# Patient Record
Sex: Female | Born: 1965 | Race: White | Hispanic: Yes | Marital: Married | Smoking: Never smoker
Health system: Southern US, Community
[De-identification: ages and names within clinical notes are randomized; demographics above are authoritative.]

## PROBLEM LIST (undated history)

## (undated) DIAGNOSIS — I471 Supraventricular tachycardia, unspecified: Secondary | ICD-10-CM

## (undated) DIAGNOSIS — G43909 Migraine, unspecified, not intractable, without status migrainosus: Secondary | ICD-10-CM

## (undated) DIAGNOSIS — D242 Benign neoplasm of left breast: Secondary | ICD-10-CM

## (undated) DIAGNOSIS — E785 Hyperlipidemia, unspecified: Secondary | ICD-10-CM

## (undated) DIAGNOSIS — R739 Hyperglycemia, unspecified: Secondary | ICD-10-CM

## (undated) DIAGNOSIS — IMO0002 Reserved for concepts with insufficient information to code with codable children: Secondary | ICD-10-CM

## (undated) DIAGNOSIS — Z789 Other specified health status: Secondary | ICD-10-CM

## (undated) DIAGNOSIS — K219 Gastro-esophageal reflux disease without esophagitis: Secondary | ICD-10-CM

## (undated) DIAGNOSIS — J310 Chronic rhinitis: Secondary | ICD-10-CM

## (undated) DIAGNOSIS — J302 Other seasonal allergic rhinitis: Secondary | ICD-10-CM

## (undated) HISTORY — DX: Reserved for concepts with insufficient information to code with codable children: IMO0002

## (undated) HISTORY — PX: DILATION AND CURETTAGE OF UTERUS: SHX78

## (undated) HISTORY — DX: Hyperlipidemia, unspecified: E78.5

## (undated) HISTORY — DX: Hyperglycemia, unspecified: R73.9

## (undated) HISTORY — PX: BREAST SURGERY: SHX581

## (undated) HISTORY — PX: OTHER SURGICAL HISTORY: SHX169

## (undated) HISTORY — DX: Other specified health status: Z78.9

---

## 2009-02-22 ENCOUNTER — Ambulatory Visit (HOSPITAL_COMMUNITY): Admission: RE | Admit: 2009-02-22 | Discharge: 2009-02-22 | Payer: Self-pay | Admitting: Obstetrics & Gynecology

## 2010-01-11 ENCOUNTER — Inpatient Hospital Stay (HOSPITAL_COMMUNITY): Admission: AD | Admit: 2010-01-11 | Discharge: 2010-01-11 | Payer: Self-pay | Admitting: Obstetrics & Gynecology

## 2010-11-05 LAB — CBC
HCT: 40.6 % (ref 36.0–46.0)
MCHC: 34.2 g/dL (ref 30.0–36.0)
MCV: 93.6 fL (ref 78.0–100.0)
Platelets: 366 10*3/uL (ref 150–400)

## 2010-11-05 LAB — RH IMMUNE GLOBULIN WORKUP (NOT WOMEN'S HOSP)
ABO/RH(D): AB NEG
Antibody Screen: NEGATIVE

## 2010-11-05 LAB — ABO/RH: ABO/RH(D): AB NEG

## 2010-11-05 LAB — HCG, QUANTITATIVE, PREGNANCY: hCG, Beta Chain, Quant, S: 9966 m[IU]/mL — ABNORMAL HIGH (ref <5)

## 2010-12-10 ENCOUNTER — Other Ambulatory Visit: Payer: Self-pay | Admitting: Obstetrics & Gynecology

## 2010-12-10 DIAGNOSIS — Z3201 Encounter for pregnancy test, result positive: Secondary | ICD-10-CM

## 2010-12-17 ENCOUNTER — Encounter (HOSPITAL_COMMUNITY): Payer: Self-pay

## 2010-12-17 ENCOUNTER — Ambulatory Visit (HOSPITAL_COMMUNITY)
Admission: RE | Admit: 2010-12-17 | Discharge: 2010-12-17 | Disposition: A | Discharge status: Home / Self Care | Payer: Commercial Managed Care - PPO | Source: Ambulatory Visit | Attending: Obstetrics & Gynecology | Admitting: Obstetrics & Gynecology

## 2010-12-17 ENCOUNTER — Ambulatory Visit (HOSPITAL_COMMUNITY): Payer: Commercial Managed Care - PPO

## 2010-12-17 DIAGNOSIS — O368390 Maternal care for abnormalities of the fetal heart rate or rhythm, unspecified trimester, not applicable or unspecified: Secondary | ICD-10-CM | POA: Insufficient documentation

## 2010-12-17 DIAGNOSIS — Z3689 Encounter for other specified antenatal screening: Secondary | ICD-10-CM | POA: Insufficient documentation

## 2010-12-17 DIAGNOSIS — Z3201 Encounter for pregnancy test, result positive: Secondary | ICD-10-CM

## 2010-12-25 ENCOUNTER — Other Ambulatory Visit: Payer: Self-pay | Admitting: Obstetrics & Gynecology

## 2010-12-25 DIAGNOSIS — O3680X0 Pregnancy with inconclusive fetal viability, not applicable or unspecified: Secondary | ICD-10-CM

## 2010-12-31 ENCOUNTER — Ambulatory Visit (HOSPITAL_COMMUNITY)
Admission: RE | Admit: 2010-12-31 | Discharge: 2010-12-31 | Disposition: A | Discharge status: Home / Self Care | Payer: Commercial Managed Care - PPO | Source: Ambulatory Visit | Attending: Obstetrics & Gynecology | Admitting: Obstetrics & Gynecology

## 2010-12-31 DIAGNOSIS — O3680X0 Pregnancy with inconclusive fetal viability, not applicable or unspecified: Secondary | ICD-10-CM

## 2010-12-31 DIAGNOSIS — O368390 Maternal care for abnormalities of the fetal heart rate or rhythm, unspecified trimester, not applicable or unspecified: Secondary | ICD-10-CM | POA: Insufficient documentation

## 2010-12-31 DIAGNOSIS — Z3689 Encounter for other specified antenatal screening: Secondary | ICD-10-CM | POA: Insufficient documentation

## 2011-01-01 ENCOUNTER — Ambulatory Visit (HOSPITAL_COMMUNITY)
Admission: RE | Admit: 2011-01-01 | Discharge: 2011-01-01 | Disposition: A | Discharge status: Home / Self Care | Payer: Commercial Managed Care - PPO | Source: Ambulatory Visit | Attending: Obstetrics & Gynecology | Admitting: Obstetrics & Gynecology

## 2011-01-01 ENCOUNTER — Other Ambulatory Visit: Payer: Self-pay | Admitting: Obstetrics & Gynecology

## 2011-01-01 DIAGNOSIS — O021 Missed abortion: Secondary | ICD-10-CM | POA: Insufficient documentation

## 2011-01-01 LAB — CBC
MCH: 30.6 pg (ref 26.0–34.0)
RBC: 4.51 MIL/uL (ref 3.87–5.11)
RDW: 13.2 % (ref 11.5–15.5)
WBC: 9 10*3/uL (ref 4.0–10.5)

## 2011-01-02 LAB — RH IMMUNE GLOB WKUP(>/=20WKS)(NOT WOMEN'S HOSP)

## 2011-01-07 NOTE — Op Note (Signed)
  [name], Carrie Lang            ACCOUNT NO.:  [id number]  MEDICAL RECORD NO.:  [id number]           PATIENT TYPE:  O  LOCATION:  [facility]                          FACILITY:  [facility]  PHYSICIAN:  Roseanna Rainbow, M.D.DATE OF BIRTH:  [birth year]  DATE OF PROCEDURE:  01/01/2011 DATE OF DISCHARGE:                              OPERATIVE REPORT   PREOPERATIVE DIAGNOSES:  Embryonic demise, missed abortion.  POSTOPERATIVE DIAGNOSES:  Embryonic demise, missed abortion.  PROCEDURE:  Suction dilatation and curettage.  SURGEON:  Roseanna Rainbow, MD  ANESTHESIA:  Managed anesthesia care, paracervical block.  FINDINGS:  The uterus was anteverted, slightly enlarged.  PATHOLOGY:  Products of conception.  ESTIMATED BLOOD LOSS:  Minimal.  COMPLICATIONS:  None.  PROCEDURE:  The patient was taken to the operating room with an IV running.  She was placed in a semi-lithotomy position in [place] stirrups and prepped and draped in the usual sterile fashion.  After a time-out had been completed, a speculum was placed in the vagina.  The anterior lip of the cervix was then infiltrated with 2 mL of 1% lidocaine plain. The single-tooth tenaculum was then applied to this location.  4 mL of 1% lidocaine plain was then injected at 4 and 7 o'clock to produce a paracervical block.  The cervix was then dilated with [facility] dilators.  A 7-mm suction curette was then introduced into the uterine cavity.  The curette was then activated and the curette rotated to evacuate the uterus of products of conception.  A sharp curettage was performed and gritty texture was noted.  A final pass was then made with the suction curette.  The single-tooth tenaculum was then removed with minimal bleeding noted from the cervix.  At the close of the procedure, the instrument and pack counts were said to be correct x2.  The patient was taken to the PACU awake and in stable condition.     Roseanna Rainbow,  M.D.    Judee Clara  D:  01/01/2011  T:  2011/01/28  Job:  [id number]  Electronically Signed by Antionette Char M.D. on 01/07/2011 07:45:21 AM

## 2011-01-18 DEATH — deceased

## 2011-04-11 ENCOUNTER — Ambulatory Visit (HOSPITAL_BASED_OUTPATIENT_CLINIC_OR_DEPARTMENT_OTHER)
Admission: RE | Admit: 2011-04-11 | Discharge: 2011-04-11 | Disposition: A | Discharge status: Home / Self Care | Payer: Commercial Managed Care - PPO | Source: Ambulatory Visit | Attending: Orthopedic Surgery | Admitting: Orthopedic Surgery

## 2011-04-11 ENCOUNTER — Other Ambulatory Visit: Payer: Self-pay | Admitting: Orthopedic Surgery

## 2011-04-11 DIAGNOSIS — L923 Foreign body granuloma of the skin and subcutaneous tissue: Secondary | ICD-10-CM | POA: Insufficient documentation

## 2011-04-16 NOTE — Op Note (Signed)
NAMEMCKENZI, BUONOMO            ACCOUNT NO.:  [id number]  MEDICAL RECORD NO.:  [id number]  LOCATION:                                 FACILITY:  PHYSICIAN:  Katy Fitch. Carrie Lang, M.D. DATE OF BIRTH:  January 25, 1966  DATE OF PROCEDURE:  04/11/2011 DATE OF DISCHARGE:                              OPERATIVE REPORT   PREOPERATIVE DIAGNOSIS:  A 45-month history of retained foreign body left index finger pulp with granuloma formation.  POSTOPERATIVE DIAGNOSIS:  A 45-month history of retained foreign body left index finger pulp with granuloma formation.  OPERATIONS:  Resection of foreign body granuloma from left index finger radial pulp.  OPERATING SURGEON:  Katy Fitch. Carrie Mula, MD  No assistant.  ANESTHESIA:  2% lidocaine metacarpal head level block 2.5 mL left index finger.  This was a minor operating room procedure.  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  She accidentally impaled her left index finger with a wooden splinter more than 2 months ago.  She has tried on numerous occasions to relieve the splinter, however, it is trapped beneath the dermis causing foreign body granuloma.  This is painful for her and impairs her ability to function clinically.  She requested a Hand Surgery consult for excision.  We advised her as to the complexity of chronic foreign bodies.  Our plan is to under local anesthesia and tourniquet hemostasis, explore the fingertip.  It appears that this is adherent to the dermis, however, it is possible she has a deeper foreign body that may be challenging to find in the pulp or fat.  Our goal is to explore the finger pulp, remove the obvious granuloma and look for a deeper foreign body.  Questions regarding her  predicament were invited and answered in detail.  DESCRIPTION OF PROCEDURE:  Carrie Lang was  brought to room 1 of the [facility] and placed in supine position on the operating table.  Following an anesthesia informed consent, the palm was prepped with alcohol and Betadine followed by application of 2.5 mL metacarpal head level digital block including flexor sheath block.  After few moments, excellent anesthesia of the index finger was achieved.  The left hand and arm were then prepped with Betadine soap and solution and sterilely draped.  After a routine surgical time-out, the index finger was exsanguinated with gauze wrap and a half-inch Penrose drain placed over the proximal phalangeal segment of the finger as digital tourniquet.  The procedure commenced with an elliptical excision of the obvious granuloma.  This was quite firm and appeared to be both fibrotic and inflammatory.  The full-thickness of epidermis and dermis was excised with the apparent foreign body granuloma present.  We then carefully explored the pulp with a Therapist, nutritional and digital palpation.  I could not confidently identify any deeper foreign bodies.  The wound was then repaired with simple interrupted suture of 5-0 nylon.  Dr. Ludwig Lang was placed in compressive dressing with Xeroflo,sterile gauze, and Coban.  I have instructed her to elevate her finger today. She will keep the dressing clean and dry.  She will remove  the dressing in 3 days and begin use of Band-Aids and/or a Coban dressing to allow her to return to clinical medicine activities.  She will return to see me in the office for follow up in a week for suture removal.  There were no apparent complications.  We did send this specimen for pathologic evaluation due to the granuloma formation.     Katy Fitch Carrie Lang, M.D.     RVS/MEDQ  D:  04/11/2011  T:  04/11/2011  Job:  [id number]  cc:   Carrie Lang, M.D.  Electronically Signed by Carrie Lang M.D. on 04/16/2011 08:25:06 AM

## 2011-10-03 ENCOUNTER — Other Ambulatory Visit: Payer: Self-pay | Admitting: Obstetrics & Gynecology

## 2011-10-03 DIAGNOSIS — N939 Abnormal uterine and vaginal bleeding, unspecified: Secondary | ICD-10-CM

## 2011-10-10 ENCOUNTER — Encounter (HOSPITAL_COMMUNITY): Payer: Self-pay

## 2011-10-10 ENCOUNTER — Ambulatory Visit (HOSPITAL_COMMUNITY)
Admission: RE | Admit: 2011-10-10 | Discharge: 2011-10-10 | Disposition: A | Discharge status: Home / Self Care | Payer: Commercial Managed Care - PPO | Source: Ambulatory Visit | Attending: Obstetrics & Gynecology | Admitting: Obstetrics & Gynecology

## 2011-10-10 ENCOUNTER — Ambulatory Visit (HOSPITAL_COMMUNITY): Payer: Commercial Managed Care - PPO

## 2011-10-10 DIAGNOSIS — N949 Unspecified condition associated with female genital organs and menstrual cycle: Secondary | ICD-10-CM | POA: Insufficient documentation

## 2011-10-10 DIAGNOSIS — N939 Abnormal uterine and vaginal bleeding, unspecified: Secondary | ICD-10-CM

## 2011-10-10 DIAGNOSIS — N938 Other specified abnormal uterine and vaginal bleeding: Secondary | ICD-10-CM | POA: Insufficient documentation

## 2011-10-10 MED ORDER — IOHEXOL 300 MG/ML  SOLN
7.0000 mL | Freq: Once | INTRAMUSCULAR | Status: AC | PRN
  Administered 2011-10-10: 7 mL

## 2011-10-14 ENCOUNTER — Ambulatory Visit (HOSPITAL_COMMUNITY): Payer: Commercial Managed Care - PPO

## 2012-06-25 ENCOUNTER — Emergency Department (INDEPENDENT_AMBULATORY_CARE_PROVIDER_SITE_OTHER)
Admission: EM | Admit: 2012-06-25 | Discharge: 2012-06-25 | Disposition: A | Discharge status: Home / Self Care | Payer: Self-pay | Source: Home / Self Care | Attending: Family Medicine | Admitting: Family Medicine

## 2012-06-25 ENCOUNTER — Encounter (HOSPITAL_COMMUNITY): Payer: Self-pay | Admitting: *Deleted

## 2012-06-25 DIAGNOSIS — I889 Nonspecific lymphadenitis, unspecified: Secondary | ICD-10-CM

## 2012-06-25 HISTORY — DX: Chronic rhinitis: J31.0

## 2012-06-25 HISTORY — DX: Migraine, unspecified, not intractable, without status migrainosus: G43.909

## 2012-06-25 HISTORY — DX: Gastro-esophageal reflux disease without esophagitis: K21.9

## 2012-06-25 LAB — COMPREHENSIVE METABOLIC PANEL
ALT: 25 U/L (ref 0–35)
AST: 26 U/L (ref 0–37)
Albumin: 4.1 g/dL (ref 3.5–5.2)
Alkaline Phosphatase: 61 U/L (ref 39–117)
Glucose, Bld: 95 mg/dL (ref 70–99)
Potassium: 3.7 mEq/L (ref 3.5–5.1)
Sodium: 139 mEq/L (ref 135–145)
Total Protein: 8.3 g/dL (ref 6.0–8.3)

## 2012-06-25 LAB — CBC WITH DIFFERENTIAL/PLATELET
Basophils Absolute: 0 10*3/uL (ref 0.0–0.1)
Basophils Relative: 0 % (ref 0–1)
Eosinophils Absolute: 0.1 10*3/uL (ref 0.0–0.7)
Lymphs Abs: 1.6 10*3/uL (ref 0.7–4.0)
MCH: 29.9 pg (ref 26.0–34.0)
Neutrophils Relative %: 67 % (ref 43–77)
Platelets: 354 10*3/uL (ref 150–400)
RBC: 4.75 MIL/uL (ref 3.87–5.11)

## 2012-06-25 LAB — POCT URINALYSIS DIP (DEVICE)
Bilirubin Urine: NEGATIVE
Ketones, ur: NEGATIVE mg/dL
Nitrite: NEGATIVE
Protein, ur: 30 mg/dL — AB
Urobilinogen, UA: 0.2 mg/dL (ref 0.0–1.0)
pH: 6.5 (ref 5.0–8.0)

## 2012-06-25 LAB — POCT INFECTIOUS MONO SCREEN: Mono Screen: NEGATIVE

## 2012-06-25 MED ORDER — FLUCONAZOLE 150 MG PO TABS: ORAL_TABLET | ORAL | Status: DC

## 2012-06-25 MED ORDER — METRONIDAZOLE 500 MG PO TABS: 500.0000 mg | ORAL_TABLET | Freq: Two times a day (BID) | ORAL | Status: DC

## 2012-06-25 NOTE — ED Notes (Signed)
Pt    Reports  Symptoms  Of  Fever  Earlier  As  Well  As  Swollen  Inguinal  Lymph         And  Vaginal  Discharge             Symptoms  X    sev  Days   -  Walks   Upright  With  Steady  Fluid  Gait      Pleasant  Appears  In nioo  Severe  Distress

## 2012-06-26 LAB — GC/CHLAMYDIA PROBE AMP, GENITAL: GC Probe Amp, Genital: NEGATIVE

## 2012-06-26 LAB — CMV IGM: CMV IgM: 0.25 (ref <0.90)

## 2012-06-26 NOTE — ED Provider Notes (Signed)
History     CSN: [id number]  Arrival date & time 06/25/12  1129   First MD Initiated Contact with Patient 06/25/12 1238      Chief Complaint  Patient presents with  . Fever    (Consider location/radiation/quality/duration/timing/severity/associated sxs/prior treatment) HPI Comments: 46 y/o female no significant PMH here c/o brief episode of fever, generalized malaise and body aches yesterday. Symptoms associated today with enlarged tender lymph nodes in both groins, vaginal itchiness and burning and mild vaginal discharge. No prior h/o HSV or STD's. She is in a monogamous relations ship and the couple has been extensively screened recently for STD's as they have been managed for infertility (IVF treatment). Patient is not pregnant currently. 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   Past Medical History  Diagnosis Date  . GERD (gastroesophageal reflux disease)   . Migraine   . Rhinitis     History reviewed. No pertinent past surgical history.  No family history on file.  History  Substance Use Topics  . Smoking status: Never Smoker   . Smokeless tobacco: Not on file  . Alcohol Use: No    OB History    Grav Para Term Preterm Abortions TAB SAB Ect Mult Living   1               Review of Systems  Constitutional: Positive for fever and fatigue.  HENT: Negative for sore throat.   Gastrointestinal: Negative for nausea, vomiting, abdominal pain and diarrhea.  Genitourinary: Positive for vaginal discharge and vaginal pain. Negative for dysuria, frequency, hematuria, flank pain, vaginal bleeding, genital sores and pelvic pain.  Musculoskeletal: Positive for myalgias and arthralgias. Negative for joint swelling.  Skin: Negative for rash.  Neurological: Negative for dizziness and headaches.  Hematological: Positive for adenopathy.  All other systems reviewed and are negative.    Allergies  Review of  patient's allergies indicates no known allergies.  Home Medications   Current Outpatient Rx  Name  Route  Sig  Dispense  Refill  . DEXILANT PO   Oral   Take by mouth.         Marland Kitchen FLUCONAZOLE 150 MG PO TABS      1 tab po x1 reapeat in 72 hours.   2 tablet   0   . METRONIDAZOLE 500 MG PO TABS   Oral   Take 1 tablet (500 mg total) by mouth 2 (two) times daily.   14 tablet   0     BP 122/76  Pulse 72  Temp 98.6 F (37 C) (Oral)  Resp 16  SpO2 100%  LMP 06/16/2012  Breastfeeding? No  Physical Exam  Nursing note and vitals reviewed. Constitutional: She is oriented to person, place, and time. She appears well-developed and well-nourished. No distress.  HENT:  Head: Normocephalic and atraumatic.  Nose: Nose normal.       Mild pharyngeal erythema no exudates.  Eyes: Conjunctivae normal are normal. Pupils are equal, round, and reactive to light. No scleral icterus.  Neck: Neck supple. No thyromegaly present.       Mildly enlarged tender submandibular left neck lymphnode.   Cardiovascular: Normal rate, regular rhythm and normal heart sounds.   Pulmonary/Chest: Effort normal and breath sounds normal. No respiratory distress. She has no wheezes. She has no rales. She exhibits no tenderness.  Abdominal: Soft. Bowel sounds are normal. She exhibits no distension and no mass. There is no tenderness.  There is no rebound and no guarding.       No hepatosplenomegaly.  Genitourinary: Uterus normal.    There is no rash, tenderness, lesion or injury on the right labia. There is no rash, tenderness, lesion or injury on the left labia. Cervix exhibits no motion tenderness, no discharge and no friability. Right adnexum displays no mass, no tenderness and no fullness. Left adnexum displays no mass, no tenderness and no fullness. There is erythema around the vagina. Vaginal discharge found.    Lymphadenopathy:       Head (left side): Submandibular adenopathy present.    She has cervical  adenopathy.       Right cervical: No superficial cervical and no deep cervical adenopathy present.      Left cervical: Superficial cervical adenopathy present. No deep cervical adenopathy present.       Right axillary: No pectoral and no lateral adenopathy present.       Left axillary: No pectoral and no lateral adenopathy present.      Right: Inguinal adenopathy present. No supraclavicular and no epitrochlear adenopathy present.       Left: Inguinal adenopathy present. No supraclavicular and no epitrochlear adenopathy present.       marginally enlarged tender lymph nodes as above. Firm but no hard. No fluctuating no associated with erythema cellulitis or drainage. No attached to deep planes.  Neurological: She is alert and oriented to person, place, and time.  Skin: No rash noted. She is not diaphoretic.    ED Course  Procedures (including critical care time)  Labs Reviewed  POCT URINALYSIS DIP (DEVICE) - Abnormal; Notable for the following:    Hgb urine dipstick LARGE (*)     Protein, ur 30 (*)     All other components within normal limits  COMPREHENSIVE METABOLIC PANEL - Abnormal; Notable for the following:    Total Bilirubin 0.2 (*)     All other components within normal limits  POCT PREGNANCY, URINE  CBC WITH DIFFERENTIAL  SEDIMENTATION RATE  POCT INFECTIOUS MONO SCREEN  HERPES SIMPLEX VIRUS CULTURE  CMV IGM  MISCELLANEOUS TEST   No results found.   1. Lymphadenitis       MDM  Impress reactive lymphadenopathies. Reports known chronic trace hematuria. Likely related to acute viral illness. Normal CBC w/diff, sed rate and CMET and poc Mono screen. HSV pending, CMV IgG, GC/CHl pending. Supportive care reccommended.         Sharin Grave, MD 06/26/12 [phone]

## 2012-06-26 NOTE — ED Notes (Signed)
Waiting for final report resulting on labs

## 2012-06-29 LAB — HERPES SIMPLEX VIRUS CULTURE: Culture: NOT DETECTED

## 2012-09-18 ENCOUNTER — Encounter (HOSPITAL_COMMUNITY): Payer: Self-pay | Admitting: Pharmacist

## 2012-09-21 ENCOUNTER — Encounter (HOSPITAL_COMMUNITY): Payer: Self-pay | Admitting: *Deleted

## 2012-09-30 ENCOUNTER — Encounter (HOSPITAL_COMMUNITY): Payer: Self-pay

## 2012-09-30 ENCOUNTER — Encounter (HOSPITAL_COMMUNITY)
Admission: RE | Admit: 2012-09-30 | Discharge: 2012-09-30 | Disposition: A | Discharge status: Home / Self Care | Payer: Commercial Managed Care - PPO | Source: Ambulatory Visit | Attending: Obstetrics & Gynecology | Admitting: Obstetrics & Gynecology

## 2012-09-30 HISTORY — DX: Supraventricular tachycardia, unspecified: I47.10

## 2012-09-30 HISTORY — DX: Supraventricular tachycardia: I47.1

## 2012-09-30 LAB — CBC
MCH: 30.1 pg (ref 26.0–34.0)
MCHC: 33.7 g/dL (ref 30.0–36.0)
MCV: 89.4 fL (ref 78.0–100.0)
Platelets: 382 10*3/uL (ref 150–400)

## 2012-09-30 NOTE — Patient Instructions (Addendum)
20 SIA GABRIELSEN  09/30/2012   Your procedure is scheduled on:  10/02/12  Enter through the Main Entrance of [facility] at 1100 AM.  Pick up the phone at the desk and dial [phone].   Call this number if you have problems the morning of surgery: [phone]   Remember:   Do not eat food:After Midnight.  Do not drink clear liquids: After Midnight.  Take these medicines the morning of surgery with A SIP OF WATER: NA   Do not wear jewelry, make-up or nail polish.  Do not wear lotions, powders, or perfumes. You may wear deodorant.  Do not shave 48 hours prior to surgery.  Do not bring valuables to the hospital.  Contacts, dentures or bridgework may not be worn into surgery.  Leave suitcase in the car. After surgery it may be brought to your room.  For patients admitted to the hospital, checkout time is 11:00 AM the day of discharge.   Patients discharged the day of surgery will not be allowed to drive home.  Name and phone number of your driver: husband   Wende Mott  Special Instructions: Shower using CHG 2 nights before surgery and the night before surgery.  If you shower the day of surgery use CHG.  Use special wash - you have one bottle of CHG for all showers.  You should use approximately 1/3 of the bottle for each shower.   Please read over the following fact sheets that you were given: Surgical Site Infection Prevention

## 2012-10-02 ENCOUNTER — Encounter (HOSPITAL_COMMUNITY): Payer: Self-pay | Admitting: Anesthesiology

## 2012-10-02 ENCOUNTER — Ambulatory Visit (HOSPITAL_COMMUNITY)
Admission: RE | Admit: 2012-10-02 | Discharge: 2012-10-02 | Disposition: A | Discharge status: Home / Self Care | Payer: Commercial Managed Care - PPO | Source: Ambulatory Visit | Attending: Obstetrics & Gynecology | Admitting: Obstetrics & Gynecology

## 2012-10-02 ENCOUNTER — Encounter (HOSPITAL_COMMUNITY): Payer: Self-pay | Admitting: *Deleted

## 2012-10-02 ENCOUNTER — Encounter (HOSPITAL_COMMUNITY)
Admission: RE | Disposition: A | Discharge status: Home / Self Care | Payer: Self-pay | Source: Ambulatory Visit | Attending: Obstetrics & Gynecology

## 2012-10-02 ENCOUNTER — Ambulatory Visit (HOSPITAL_COMMUNITY): Payer: Commercial Managed Care - PPO | Admitting: Anesthesiology

## 2012-10-02 DIAGNOSIS — N840 Polyp of corpus uteri: Secondary | ICD-10-CM | POA: Diagnosis present

## 2012-10-02 DIAGNOSIS — N938 Other specified abnormal uterine and vaginal bleeding: Secondary | ICD-10-CM | POA: Insufficient documentation

## 2012-10-02 DIAGNOSIS — N949 Unspecified condition associated with female genital organs and menstrual cycle: Secondary | ICD-10-CM | POA: Insufficient documentation

## 2012-10-02 HISTORY — PX: HYSTEROSCOPY WITH D & C: SHX1775

## 2012-10-02 HISTORY — DX: Other seasonal allergic rhinitis: J30.2

## 2012-10-02 LAB — PREGNANCY, URINE: Preg Test, Ur: NEGATIVE

## 2012-10-02 SURGERY — DILATATION AND CURETTAGE /HYSTEROSCOPY
Anesthesia: General | Site: Vagina | Wound class: Clean Contaminated

## 2012-10-02 MED ORDER — LIDOCAINE HCL (CARDIAC) 20 MG/ML IV SOLN
INTRAVENOUS | Status: AC
  Filled 2012-10-02: qty 5

## 2012-10-02 MED ORDER — PROPOFOL 10 MG/ML IV BOLUS
INTRAVENOUS | Status: DC | PRN
  Administered 2012-10-02: 170 mg via INTRAVENOUS

## 2012-10-02 MED ORDER — LIDOCAINE HCL (CARDIAC) 20 MG/ML IV SOLN
INTRAVENOUS | Status: DC | PRN
  Administered 2012-10-02: 50 mg via INTRAVENOUS

## 2012-10-02 MED ORDER — MIDAZOLAM HCL 2 MG/2ML IJ SOLN
INTRAMUSCULAR | Status: AC
  Filled 2012-10-02: qty 2

## 2012-10-02 MED ORDER — ONDANSETRON HCL 4 MG/2ML IJ SOLN: 4.0000 mg | Freq: Four times a day (QID) | INTRAMUSCULAR | Status: DC | PRN

## 2012-10-02 MED ORDER — LACTATED RINGERS IV SOLN
INTRAVENOUS | Status: DC
  Administered 2012-10-02: 13:00:00 via INTRAVENOUS
  Administered 2012-10-02: 50 mL/h via INTRAVENOUS

## 2012-10-02 MED ORDER — OXYCODONE-ACETAMINOPHEN 5-325 MG PO TABS: 2.0000 | ORAL_TABLET | Freq: Four times a day (QID) | ORAL | Status: DC | PRN

## 2012-10-02 MED ORDER — FENTANYL CITRATE 0.05 MG/ML IJ SOLN: 25.0000 ug | INTRAMUSCULAR | Status: DC | PRN

## 2012-10-02 MED ORDER — ONDANSETRON HCL 4 MG/2ML IJ SOLN
INTRAMUSCULAR | Status: DC | PRN
  Administered 2012-10-02: 4 mg via INTRAVENOUS

## 2012-10-02 MED ORDER — SODIUM CHLORIDE 0.9 % IJ SOLN: 3.0000 mL | Freq: Two times a day (BID) | INTRAMUSCULAR | Status: DC

## 2012-10-02 MED ORDER — MIDAZOLAM HCL 5 MG/5ML IJ SOLN
INTRAMUSCULAR | Status: DC | PRN
  Administered 2012-10-02: 2 mg via INTRAVENOUS

## 2012-10-02 MED ORDER — SODIUM CHLORIDE 0.9 % IV SOLN: 250.0000 mL | INTRAVENOUS | Status: DC | PRN

## 2012-10-02 MED ORDER — LIDOCAINE HCL 1 % IJ SOLN
INTRAMUSCULAR | Status: DC | PRN
  Administered 2012-10-02: 10 mL

## 2012-10-02 MED ORDER — ACETAMINOPHEN 650 MG RE SUPP
650.0000 mg | RECTAL | Status: DC | PRN
  Filled 2012-10-02: qty 1

## 2012-10-02 MED ORDER — KETOROLAC TROMETHAMINE 30 MG/ML IJ SOLN
INTRAMUSCULAR | Status: DC | PRN
  Administered 2012-10-02: 30 mg via INTRAVENOUS

## 2012-10-02 MED ORDER — SODIUM CHLORIDE 0.9 % IJ SOLN: 3.0000 mL | INTRAMUSCULAR | Status: DC | PRN

## 2012-10-02 MED ORDER — ACETAMINOPHEN 325 MG PO TABS: 650.0000 mg | ORAL_TABLET | ORAL | Status: DC | PRN

## 2012-10-02 MED ORDER — DEXAMETHASONE SODIUM PHOSPHATE 10 MG/ML IJ SOLN
INTRAMUSCULAR | Status: AC
  Filled 2012-10-02: qty 1

## 2012-10-02 MED ORDER — KETOROLAC TROMETHAMINE 30 MG/ML IJ SOLN
INTRAMUSCULAR | Status: AC
  Filled 2012-10-02: qty 1

## 2012-10-02 MED ORDER — DEXAMETHASONE SODIUM PHOSPHATE 10 MG/ML IJ SOLN
INTRAMUSCULAR | Status: DC | PRN
  Administered 2012-10-02: 10 mg via INTRAVENOUS

## 2012-10-02 MED ORDER — SODIUM CHLORIDE 0.9 % IR SOLN
Status: DC | PRN
  Administered 2012-10-02: 3000 mL

## 2012-10-02 MED ORDER — FENTANYL CITRATE 0.05 MG/ML IJ SOLN
INTRAMUSCULAR | Status: AC
  Filled 2012-10-02: qty 2

## 2012-10-02 MED ORDER — ONDANSETRON HCL 4 MG/2ML IJ SOLN
INTRAMUSCULAR | Status: AC
  Filled 2012-10-02: qty 2

## 2012-10-02 MED ORDER — FENTANYL CITRATE 0.05 MG/ML IJ SOLN
INTRAMUSCULAR | Status: DC | PRN
  Administered 2012-10-02: 25 ug via INTRAVENOUS
  Administered 2012-10-02: 50 ug via INTRAVENOUS
  Administered 2012-10-02: 25 ug via INTRAVENOUS

## 2012-10-02 MED ORDER — OXYCODONE HCL 5 MG PO TABS: 5.0000 mg | ORAL_TABLET | ORAL | Status: DC | PRN

## 2012-10-02 MED ORDER — PROPOFOL 10 MG/ML IV EMUL
INTRAVENOUS | Status: AC
  Filled 2012-10-02: qty 20

## 2012-10-02 SURGICAL SUPPLY — 18 items
BLADE INCISOR TRUC PLUS 2.9 (ABLATOR) ×1 IMPLANT
CANISTER SUCTION 2500CC (MISCELLANEOUS) ×2 IMPLANT
CATH ROBINSON RED A/P 16FR (CATHETERS) ×2 IMPLANT
CLOTH BEACON ORANGE TIMEOUT ST (SAFETY) ×2 IMPLANT
CONTAINER PREFILL 10% NBF 60ML (FORM) ×4 IMPLANT
DRESSING TELFA 8X3 (GAUZE/BANDAGES/DRESSINGS) ×2 IMPLANT
ELECT REM PT RETURN 9FT ADLT (ELECTROSURGICAL) ×2
ELECTRODE REM PT RTRN 9FT ADLT (ELECTROSURGICAL) ×1 IMPLANT
GLOVE BIO SURGEON STRL SZ 6.5 (GLOVE) ×4 IMPLANT
GOWN STRL REIN XL XLG (GOWN DISPOSABLE) ×4 IMPLANT
INCISOR TRUC PLUS BLADE 2.9 (ABLATOR) ×2
KIT HYSTEROSCOPY TRUCLEAR (ABLATOR) ×2 IMPLANT
LOOP ANGLED CUTTING 22FR (CUTTING LOOP) IMPLANT
NEEDLE SPNL 20GX3.5 QUINCKE YW (NEEDLE) IMPLANT
PACK HYSTEROSCOPY LF (CUSTOM PROCEDURE TRAY) ×2 IMPLANT
PAD OB MATERNITY 4.3X12.25 (PERSONAL CARE ITEMS) ×2 IMPLANT
TOWEL OR 17X24 6PK STRL BLUE (TOWEL DISPOSABLE) ×4 IMPLANT
WATER STERILE IRR 1000ML POUR (IV SOLUTION) ×2 IMPLANT

## 2012-10-02 NOTE — Anesthesia Preprocedure Evaluation (Signed)

## 2012-10-02 NOTE — H&P (Signed)
  Chief Complaint: 46 y.o.  who presents with AUB   Details of Present Illness: The work-up to date has included imaging that is suspect for an endometrial polyp.  Ht 5\' 6"  (1.676 m)  Wt 70.308 kg (155 lb)  BMI 25.03 kg/m2  Past Medical History  Diagnosis Date  . GERD (gastroesophageal reflux disease)   . Migraine   . Rhinitis   . Seasonal allergies   . SVT (supraventricular tachycardia)    History   Social History  . Marital Status: Married    Spouse Name: N/A    Number of Children: N/A  . Years of Education: N/A   Occupational History  . Not on file.   Social History Main Topics  . Smoking status: Never Smoker   . Smokeless tobacco: Not on file  . Alcohol Use: No  . Drug Use: No  . Sexually Active: Not on file   Other Topics Concern  . Not on file   Social History Narrative  . No narrative on file   No family history on file.  Pertinent items are noted in HPI.  Pre-Op Diagnosis: abnormal uterine bleeding/ poylp   Planned Procedure: Procedure(s): DILATATION AND CURETTAGE /HYSTEROSCOPY  I have reviewed the patient's history and have completed the physical exam and Carrie Lang is acceptable for surgery.  Roseanna Rainbow, MD 10/02/2012 7:32 AM

## 2012-10-02 NOTE — Op Note (Signed)
Preoperative diagnosis: AUB, endometrial polyp  Postoperative diagnosis: same  Procedure: Operative hysteroscopy, polypectomy with Truclear, dilatation and curettage  Surgeon: Antionette Char A  Anesthesia: LMA, paracervical block  Estimated blood loss: 100 ml  Urine output: per Anesthesiology  IV Fluids: per Anesthesiology  Complications: none  Specimen: PATHOLOGY  Operative Findings: The tubal ostia were visualized.  The endometrium posteriorly was slightly lush in appearance.  There was 1 cm polyp arising from the posterior wall, lower uterine segment.  Description of procedure:   The patient was taken to the operating room and placed on the operating table in the semi-lithotomy position in [place] stirrups.  Examination under anesthesia was performed.  The patient was prepped and draped in the usual manner.  After a time-out had been completed, a speculum was placed in the vagina.  The anterior lip of the cervix was grasped with a single-toothed tenaculum.    10 cc of 1% lidocaine were injected at 4 and 7 o'clock to produce a paracervical block.    The endocervical canal was dilated with Shawnie Pons dilators.  A  diagnostic hysteroscope with Glycine as the distending medium was used to perform a diagnostic hysteroscopy.  The findings are noted above.    The polyp was then morcellated with the Truclear instrument.  The endometrium was biopsied with the instrument as well.  The hysteroscope was removed.   All the instruments were removed from the vagina.  Final instrument counts were correct.  The patient was taken to the PACU in stable condition.

## 2012-10-02 NOTE — Anesthesia Postprocedure Evaluation (Signed)
  Anesthesia Post-op Note  Patient: Carrie Lang  Procedure(s) Performed: Procedure(s) with comments: DILATATION AND CURETTAGE /HYSTEROSCOPY (N/A) - POLYPECTOMY/ POSSIBLE TRUCLER  Patient is awake and responsive. Pain and nausea are reasonably well controlled. Vital signs are stable and clinically acceptable. Oxygen saturation is clinically acceptable. There are no apparent anesthetic complications at this time. Patient is ready for discharge.

## 2012-10-02 NOTE — Preoperative (Signed)
Beta Blockers   Reason not to administer Beta Blockers:Not Applicable 

## 2012-10-02 NOTE — Transfer of Care (Signed)
Immediate Anesthesia Transfer of Care Note  Patient: Carrie Lang  Procedure(s) Performed: Procedure(s) with comments: DILATATION AND CURETTAGE /HYSTEROSCOPY (N/A) - POLYPECTOMY/ POSSIBLE TRUCLER  Patient Location: PACU  Anesthesia Type:General  Level of Consciousness: awake, alert  and oriented  Airway & Oxygen Therapy: Patient Spontanous Breathing and Patient connected to nasal cannula oxygen  Post-op Assessment: Report given to PACU RN  Post vital signs: Reviewed  Complications: No apparent anesthesia complications

## 2012-10-05 ENCOUNTER — Encounter (HOSPITAL_COMMUNITY): Payer: Self-pay | Admitting: Obstetrics & Gynecology

## 2012-11-12 ENCOUNTER — Encounter: Payer: Self-pay | Admitting: Obstetrics & Gynecology

## 2012-11-12 ENCOUNTER — Ambulatory Visit (INDEPENDENT_AMBULATORY_CARE_PROVIDER_SITE_OTHER): Payer: Commercial Managed Care - PPO | Admitting: Obstetrics & Gynecology

## 2012-11-12 VITALS — BP 147/79 | HR 92 | Temp 98.3°F | Ht 66.0 in | Wt 160.0 lb

## 2012-11-12 DIAGNOSIS — Z124 Encounter for screening for malignant neoplasm of cervix: Secondary | ICD-10-CM

## 2012-11-12 MED ORDER — NESTABS 32-1 MG PO TABS: 1.0000 | ORAL_TABLET | Freq: Every morning | ORAL | Status: DC

## 2012-11-12 NOTE — Progress Notes (Signed)
Subjective:     Carrie Lang is a 47 y.o. female who presents to the clinic 6 weeks status post diagnostic hysteroscopy for polyps. Eating a regular diet with difficulty. Bowel movements are normal. The patient is not having any pain.  The following portions of the patient's history were reviewed and updated as appropriate: allergies, current medications, past family history, past medical history, past social history, past surgical history and problem list.  Review of Systems Pertinent items are noted in HPI.    Objective:    BP 147/79  Pulse 92  Temp(Src) 98.3 F (36.8 C) (Oral)  Ht 5\' 6"  (1.676 m)  Wt 160 lb (72.576 kg)  BMI 25.84 kg/m2  LMP 09/28/2012 General:  alert  Abdomen: soft, bowel sounds active, non-tender  Incision:   n/a    Pelvic: Bimanual: uterus, adnexa NT Assessment:    Doing well postoperatively. Operative findings again reviewed. Pathology report discussed.    Plan:    1. Continue any current medications. 2. Wound care discussed. 3. Activity restrictions: none 4. Anticipated return to work: now. 5. Follow up: 6 mths

## 2012-11-17 LAB — PAP IG AND HPV HIGH-RISK

## 2012-12-21 ENCOUNTER — Other Ambulatory Visit: Payer: Self-pay | Admitting: Obstetrics & Gynecology

## 2012-12-21 DIAGNOSIS — N979 Female infertility, unspecified: Secondary | ICD-10-CM

## 2012-12-22 ENCOUNTER — Other Ambulatory Visit: Payer: Commercial Managed Care - PPO

## 2012-12-22 ENCOUNTER — Ambulatory Visit (HOSPITAL_COMMUNITY)
Admission: RE | Admit: 2012-12-22 | Discharge: 2012-12-22 | Disposition: A | Discharge status: Home / Self Care | Payer: Commercial Managed Care - PPO | Source: Ambulatory Visit | Attending: Obstetrics & Gynecology | Admitting: Obstetrics & Gynecology

## 2012-12-22 DIAGNOSIS — D251 Intramural leiomyoma of uterus: Secondary | ICD-10-CM | POA: Insufficient documentation

## 2012-12-22 DIAGNOSIS — N979 Female infertility, unspecified: Secondary | ICD-10-CM | POA: Insufficient documentation

## 2012-12-22 DIAGNOSIS — D252 Subserosal leiomyoma of uterus: Secondary | ICD-10-CM | POA: Insufficient documentation

## 2012-12-22 DIAGNOSIS — N926 Irregular menstruation, unspecified: Secondary | ICD-10-CM

## 2012-12-23 ENCOUNTER — Other Ambulatory Visit: Payer: Self-pay | Admitting: Obstetrics & Gynecology

## 2012-12-23 ENCOUNTER — Ambulatory Visit (HOSPITAL_COMMUNITY): Payer: Commercial Managed Care - PPO

## 2012-12-23 ENCOUNTER — Other Ambulatory Visit: Payer: Commercial Managed Care - PPO

## 2012-12-24 ENCOUNTER — Telehealth: Payer: Self-pay | Admitting: *Deleted

## 2012-12-24 NOTE — Telephone Encounter (Signed)
Pt in office for lab - Rx called for 17 beta estradiol 2 mg 1 po tid #90  And Progesterone 200 mg 1 tid #30 called to [facility] per Dr Tamela Oddi

## 2012-12-25 ENCOUNTER — Other Ambulatory Visit: Payer: Self-pay | Admitting: Obstetrics & Gynecology

## 2012-12-25 DIAGNOSIS — N979 Female infertility, unspecified: Secondary | ICD-10-CM

## 2013-01-04 ENCOUNTER — Other Ambulatory Visit: Payer: Self-pay | Admitting: Obstetrics & Gynecology

## 2013-01-04 ENCOUNTER — Ambulatory Visit (HOSPITAL_COMMUNITY)
Admission: RE | Admit: 2013-01-04 | Discharge: 2013-01-04 | Disposition: A | Discharge status: Home / Self Care | Payer: Commercial Managed Care - PPO | Source: Ambulatory Visit | Attending: Obstetrics & Gynecology | Admitting: Obstetrics & Gynecology

## 2013-01-04 DIAGNOSIS — N979 Female infertility, unspecified: Secondary | ICD-10-CM | POA: Insufficient documentation

## 2013-03-09 ENCOUNTER — Encounter: Payer: Self-pay | Admitting: Gastroenterology

## 2013-03-22 ENCOUNTER — Encounter: Payer: Self-pay | Admitting: Obstetrics & Gynecology

## 2013-03-30 ENCOUNTER — Ambulatory Visit: Payer: Commercial Managed Care - PPO | Admitting: Gastroenterology

## 2013-05-17 ENCOUNTER — Ambulatory Visit: Payer: Commercial Managed Care - PPO | Admitting: Obstetrics & Gynecology

## 2013-06-24 ENCOUNTER — Other Ambulatory Visit: Payer: Self-pay

## 2013-06-24 ENCOUNTER — Encounter: Payer: Self-pay | Admitting: *Deleted

## 2013-06-24 ENCOUNTER — Encounter: Payer: Self-pay | Admitting: Obstetrics & Gynecology

## 2014-01-05 ENCOUNTER — Encounter: Payer: Self-pay | Admitting: Obstetrics & Gynecology

## 2014-01-05 ENCOUNTER — Ambulatory Visit (INDEPENDENT_AMBULATORY_CARE_PROVIDER_SITE_OTHER): Payer: Commercial Managed Care - PPO | Admitting: Obstetrics & Gynecology

## 2014-01-05 VITALS — BP 120/77 | HR 77 | Temp 98.0°F | Ht 66.0 in | Wt 155.0 lb

## 2014-01-05 DIAGNOSIS — D259 Leiomyoma of uterus, unspecified: Secondary | ICD-10-CM

## 2014-01-05 DIAGNOSIS — Z01419 Encounter for gynecological examination (general) (routine) without abnormal findings: Secondary | ICD-10-CM

## 2014-01-05 DIAGNOSIS — Z3202 Encounter for pregnancy test, result negative: Secondary | ICD-10-CM

## 2014-01-05 DIAGNOSIS — N979 Female infertility, unspecified: Secondary | ICD-10-CM | POA: Insufficient documentation

## 2014-01-05 LAB — COMPREHENSIVE METABOLIC PANEL
ALK PHOS: 56 U/L (ref 39–117)
ALT: 29 U/L (ref 0–35)
AST: 24 U/L (ref 0–37)
Albumin: 4.6 g/dL (ref 3.5–5.2)
BILIRUBIN TOTAL: 0.5 mg/dL (ref 0.2–1.2)
BUN: 8 mg/dL (ref 6–23)
CO2: 23 meq/L (ref 19–32)
CREATININE: 0.79 mg/dL (ref 0.50–1.10)
Calcium: 9.3 mg/dL (ref 8.4–10.5)
Chloride: 102 mEq/L (ref 96–112)
Glucose, Bld: 83 mg/dL (ref 70–99)
Potassium: 4 mEq/L (ref 3.5–5.3)
Sodium: 136 mEq/L (ref 135–145)
Total Protein: 7.4 g/dL (ref 6.0–8.3)

## 2014-01-05 LAB — CBC
HEMATOCRIT: 40.9 % (ref 36.0–46.0)
Hemoglobin: 13.9 g/dL (ref 12.0–15.0)
MCH: 29.1 pg (ref 26.0–34.0)
MCHC: 34 g/dL (ref 30.0–36.0)
MCV: 85.7 fL (ref 78.0–100.0)
Platelets: 413 10*3/uL — ABNORMAL HIGH (ref 150–400)
RBC: 4.77 MIL/uL (ref 3.87–5.11)
RDW: 14.3 % (ref 11.5–15.5)
WBC: 8.4 10*3/uL (ref 4.0–10.5)

## 2014-01-05 LAB — TSH: TSH: 0.85 u[IU]/mL (ref 0.350–4.500)

## 2014-01-05 NOTE — Progress Notes (Signed)
Subjective:     Carrie Lang is a 48 y.o. female here for a routine exam.   Personal health questionnaire:  Is patient Ashkenazi Jewish, have a family history of breast and/or ovarian cancer: no Is there a family history of uterine cancer diagnosed at age < 52, gastrointestinal cancer, urinary tract cancer, family member who is a Field seismologist syndrome-associated carrier: no Is the patient overweight and hypertensive, family history of diabetes, personal history of gestational diabetes or PCOS: no Is patient over 63, have PCOS,  family history of premature CHD under age 45, diabetes, smoke, have hypertension or peripheral artery disease:  no   Gynecologic History Patient's last menstrual period was 11/24/2013. Contraception: none Last Pap: 2014. Results were: normal Last mammogram: 1 yr ago. Results were: dense breasts  Obstetric History OB History  Gravida Para Term Preterm AB SAB TAB Ectopic Multiple Living  2    2 2         # Outcome Date GA Lbr Len/2nd Weight Sex Delivery Anes PTL Lv  2 SAB 2012 [redacted]w[redacted]d       N  1 SAB 2011 [redacted]w[redacted]d       N     Comments: System Generated. Please review and update pregnancy details.      Past Medical History  Diagnosis Date  . GERD (gastroesophageal reflux disease)   . Migraine   . Rhinitis   . Seasonal allergies   . SVT (supraventricular tachycardia)   . Gluten free diet   . Hyperlipidemia   . Ulcer     Past Surgical History  Procedure Laterality Date  . Rectal polyps      removed  . Dilation and curettage of uterus      TAB x 2  . Breast surgery      right    nodule removed - benign  . Hysteroscopy w/d&c N/A 10/02/2012    Procedure: DILATATION AND CURETTAGE /HYSTEROSCOPY;  Surgeon: Lahoma Crocker, MD;  Location: [facility];  Service: Gynecology;  Laterality: N/A;  POLYPECTOMY/ POSSIBLE TRUCLER    Current outpatient prescriptions:cetirizine (ZYRTEC) 10 MG tablet, Take 10 mg by mouth daily as needed. For allergies, Disp: , Rfl: ;   RABEprazole (ACIPHEX) 20 MG tablet, Take 20 mg by mouth daily., Disp: , Rfl: ;  SUMAtriptan (IMITREX) 100 MG tablet, Take 100 mg by mouth every 2 (two) hours as needed for migraine., Disp: , Rfl:  Allergies  Allergen Reactions  . Dexilant [Dexlansoprazole] Other (See Comments)    Fever & lymphadenopathy  . Nsaids Nausea Only    Pt states she also has heartburn    History  Substance Use Topics  . Smoking status: Never Smoker   . Smokeless tobacco: Never Used  . Alcohol Use: Yes     Comment: occasionally     Family History  Problem Relation Age of Onset  . Hypertension Mother   . Asthma Mother   . Hypertension Father   . Hyperlipidemia Father   . Heart disease Maternal Grandmother   . Hyperlipidemia Maternal Grandmother   . Heart disease Maternal Grandfather   . Hyperlipidemia Maternal Grandfather   . Heart disease Paternal Grandmother   . Hyperlipidemia Paternal Grandmother   . Heart disease Paternal Grandfather   . Hyperlipidemia Paternal Grandfather       Review of Systems  Constitutional: negative for fatigue and weight loss Respiratory: negative for cough and wheezing Cardiovascular: negative for chest pain, fatigue and palpitations Gastrointestinal: negative for abdominal pain and change in bowel  habits Musculoskeletal:negative for myalgias Neurological: negative for gait problems and tremors Behavioral/Psych: negative for abusive relationship, depression Endocrine: negative for temperature intolerance   Genitourinary:negative for abnormal menstrual periods, genital lesions, hot flashes, sexual problems and vaginal discharge Integument/breast: negative for breast lump, breast tenderness, nipple discharge and skin lesion(s)    Objective:       BP 120/77  Pulse 77  Temp(Src) 98 F (36.7 C)  Ht 5\' 6"  (1.676 m)  Wt 70.308 kg (155 lb)  BMI 25.03 kg/m2  LMP 11/24/2013 General:   alert  Skin:   no rash or abnormalities  Lungs:   clear to auscultation  bilaterally  Heart:   regular rate and rhythm, S1, S2 normal, no murmur, click, rub or gallop  Breasts:   normal without suspicious masses, skin or nipple changes or axillary nodes  Abdomen:  normal findings: no organomegaly, soft, non-tender and no hernia  Pelvis:  External genitalia: normal general appearance Urinary system: urethral meatus normal and bladder without fullness, nontender Vaginal: normal without tenderness, induration or masses Cervix: normal appearance Adnexa: normal bimanual exam Uterus: anteverted and non-tender, upper normal size   Lab Review Urine pregnancy test Labs reviewed no Radiologic studies reviewed no     Assessment:    Healthy female exam.  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

## 2014-01-05 NOTE — Addendum Note (Signed)
Addended by: Ladona Ridgel on: 01/05/2014 05:25 PM   Modules accepted: Orders

## 2014-01-05 NOTE — Patient Instructions (Signed)

## 2014-01-06 LAB — NMR LIPOPROFILE WITH LIPIDS
CHOLESTEROL, TOTAL: 240 mg/dL — AB (ref <200)
HDL PARTICLE NUMBER: 22.6 umol/L — AB (ref >=30.5)
HDL Size: 8.6 nm — ABNORMAL LOW (ref >=9.2)
HDL-C: 37 mg/dL — ABNORMAL LOW (ref >=40)
LDL CALC: 176 mg/dL — AB (ref <100)
LDL Particle Number: 2301 nmol/L — ABNORMAL HIGH (ref <1000)
LDL Size: 20.1 nm — ABNORMAL LOW (ref >20.5)
LP-IR SCORE: 65 — AB (ref <=45)
Large HDL-P: 1.6 umol/L — ABNORMAL LOW (ref >=4.8)
Large VLDL-P: 3 nmol/L — ABNORMAL HIGH (ref <=2.7)
SMALL LDL PARTICLE NUMBER: 1547 nmol/L — AB (ref <=527)
Triglycerides: 136 mg/dL (ref <150)
VLDL Size: 41.4 nm (ref <=46.6)

## 2014-01-06 LAB — POCT URINE PREGNANCY: PREG TEST UR: NEGATIVE

## 2014-01-06 NOTE — Addendum Note (Signed)
Addended by: Valli Glance F on: 01/06/2014 09:51 AM   Modules accepted: Orders

## 2014-01-21 ENCOUNTER — Encounter: Payer: Self-pay | Admitting: *Deleted

## 2014-02-07 ENCOUNTER — Other Ambulatory Visit: Payer: Self-pay

## 2014-02-07 DIAGNOSIS — Z1231 Encounter for screening mammogram for malignant neoplasm of breast: Secondary | ICD-10-CM

## 2014-02-16 ENCOUNTER — Ambulatory Visit
Admission: RE | Admit: 2014-02-16 | Discharge: 2014-02-16 | Disposition: A | Discharge status: Home / Self Care | Payer: Commercial Managed Care - PPO | Source: Ambulatory Visit

## 2014-02-16 DIAGNOSIS — Z1231 Encounter for screening mammogram for malignant neoplasm of breast: Secondary | ICD-10-CM

## 2014-02-21 ENCOUNTER — Other Ambulatory Visit: Payer: Self-pay | Admitting: Obstetrics & Gynecology

## 2014-02-21 DIAGNOSIS — R928 Other abnormal and inconclusive findings on diagnostic imaging of breast: Secondary | ICD-10-CM

## 2014-02-25 ENCOUNTER — Ambulatory Visit
Admission: RE | Admit: 2014-02-25 | Discharge: 2014-02-25 | Disposition: A | Discharge status: Home / Self Care | Payer: Commercial Managed Care - PPO | Source: Ambulatory Visit | Attending: Obstetrics & Gynecology | Admitting: Obstetrics & Gynecology

## 2014-02-25 DIAGNOSIS — R928 Other abnormal and inconclusive findings on diagnostic imaging of breast: Secondary | ICD-10-CM

## 2014-03-30 LAB — US OB LIMITED

## 2014-04-15 ENCOUNTER — Ambulatory Visit (INDEPENDENT_AMBULATORY_CARE_PROVIDER_SITE_OTHER): Payer: Commercial Managed Care - PPO | Admitting: Obstetrics & Gynecology

## 2014-04-15 ENCOUNTER — Other Ambulatory Visit: Payer: Self-pay | Admitting: Obstetrics & Gynecology

## 2014-04-15 ENCOUNTER — Encounter: Payer: Self-pay | Admitting: Obstetrics & Gynecology

## 2014-04-15 VITALS — Temp 98.2°F

## 2014-04-15 DIAGNOSIS — O0990 Supervision of high risk pregnancy, unspecified, unspecified trimester: Secondary | ICD-10-CM | POA: Insufficient documentation

## 2014-04-15 DIAGNOSIS — O26899 Other specified pregnancy related conditions, unspecified trimester: Secondary | ICD-10-CM | POA: Insufficient documentation

## 2014-04-15 DIAGNOSIS — O09511 Supervision of elderly primigravida, first trimester: Secondary | ICD-10-CM

## 2014-04-15 DIAGNOSIS — G43909 Migraine, unspecified, not intractable, without status migrainosus: Secondary | ICD-10-CM | POA: Insufficient documentation

## 2014-04-15 DIAGNOSIS — E785 Hyperlipidemia, unspecified: Secondary | ICD-10-CM | POA: Insufficient documentation

## 2014-04-15 DIAGNOSIS — O30001 Twin pregnancy, unspecified number of placenta and unspecified number of amniotic sacs, first trimester: Secondary | ICD-10-CM | POA: Insufficient documentation

## 2014-04-15 DIAGNOSIS — O360110 Maternal care for anti-D [Rh] antibodies, first trimester, not applicable or unspecified: Secondary | ICD-10-CM

## 2014-04-15 DIAGNOSIS — J309 Allergic rhinitis, unspecified: Secondary | ICD-10-CM | POA: Insufficient documentation

## 2014-04-15 DIAGNOSIS — Z6791 Unspecified blood type, Rh negative: Secondary | ICD-10-CM | POA: Insufficient documentation

## 2014-04-15 DIAGNOSIS — K219 Gastro-esophageal reflux disease without esophagitis: Secondary | ICD-10-CM | POA: Insufficient documentation

## 2014-04-15 DIAGNOSIS — Z3201 Encounter for pregnancy test, result positive: Secondary | ICD-10-CM

## 2014-04-15 DIAGNOSIS — N63 Unspecified lump in unspecified breast: Secondary | ICD-10-CM

## 2014-04-15 LAB — POCT URINALYSIS DIPSTICK
BILIRUBIN UA: NEGATIVE
Blood, UA: 250
GLUCOSE UA: 50
KETONES UA: NEGATIVE
LEUKOCYTES UA: NEGATIVE
Nitrite, UA: NEGATIVE
PH UA: 5
Spec Grav, UA: 1.025
Urobilinogen, UA: NEGATIVE

## 2014-04-15 LAB — CBC
HCT: 42.7 % (ref 36.0–46.0)
Hemoglobin: 14.4 g/dL (ref 12.0–15.0)
MCH: 30.1 pg (ref 26.0–34.0)
MCHC: 33.7 g/dL (ref 30.0–36.0)
MCV: 89.1 fL (ref 78.0–100.0)
PLATELETS: 462 10*3/uL — AB (ref 150–400)
RBC: 4.79 MIL/uL (ref 3.87–5.11)
RDW: 15.1 % (ref 11.5–15.5)
WBC: 8.5 10*3/uL (ref 4.0–10.5)

## 2014-04-15 MED ORDER — DOXYLAMINE-PYRIDOXINE 10-10 MG PO TBEC: 2.0000 | DELAYED_RELEASE_TABLET | Freq: Every day | ORAL | Status: DC

## 2014-04-15 NOTE — Patient Instructions (Signed)

## 2014-04-15 NOTE — Progress Notes (Signed)
Subjective:    Carrie Lang is being seen today for her first obstetrical visit.  This pregnancy is the outcome of ART/donor egg. She is at [redacted]w[redacted]d gestation.  Relationship with FOB: spouse, living together. Pregnancy history fully reviewed.  The information documented in the HPI was reviewed and verified.  Menstrual History: OB History   Grav Para Term Preterm Abortions TAB SAB Ect Mult Living   3    2  2           Patient's last menstrual period was 02/08/2014.    Past Medical History  Diagnosis Date  . GERD (gastroesophageal reflux disease)   . Migraine   . Rhinitis   . Seasonal allergies   . SVT (supraventricular tachycardia)   . Gluten free diet   . Hyperlipidemia   . Ulcer   . Hyperglycemia     Past Surgical History  Procedure Laterality Date  . Rectal polyps      removed  . Dilation and curettage of uterus      TAB x 2  . Breast surgery      right    nodule removed - benign  . Hysteroscopy w/d&c N/A 10/02/2012    Procedure: DILATATION AND CURETTAGE /HYSTEROSCOPY;  Surgeon: Lahoma Crocker, MD;  Location: [facility];  Service: Gynecology;  Laterality: N/A;  POLYPECTOMY/ POSSIBLE TRUCLER     (Not in a hospital admission) Allergies  Allergen Reactions  . Dexilant [Dexlansoprazole] Other (See Comments)    Fever & lymphadenopathy  . Gluten Meal   . Nsaids Nausea Only    Pt states she also has heartburn    History  Substance Use Topics  . Smoking status: Never Smoker   . Smokeless tobacco: Never Used  . Alcohol Use: No     Comment: occasionally     Family History  Problem Relation Age of Onset  . Hypertension Mother   . Asthma Mother   . Hypertension Father   . Hyperlipidemia Father   . Heart disease Maternal Grandmother   . Hyperlipidemia Maternal Grandmother   . Heart disease Maternal Grandfather   . Hyperlipidemia Maternal Grandfather   . Heart disease Paternal Grandmother   . Hyperlipidemia Paternal Grandmother   . Heart disease Paternal  Grandfather   . Hyperlipidemia Paternal Grandfather      Review of Systems Constitutional: negative for weight loss Gastrointestinal: positive for nausea/vomiting Genitourinary:negative for genital lesions and vaginal discharge and dysuria Musculoskeletal:negative for back pain Behavioral/Psych: negative for abusive relationship, depression, illegal drug usage and tobacco use    Objective:    Temp(Src) 98.2 F (36.8 C)  LMP 02/08/2014 General Appearance:    Alert, cooperative, no distress, appears stated age  Head:    Normocephalic, without obvious abnormality, atraumatic  Eyes:    PERRL, conjunctiva/corneas clear, EOM's intact, fundi    benign, both eyes  Ears:    Normal TM's and external ear canals, both ears  Nose:   Nares normal, septum midline, mucosa normal, no drainage    or sinus tenderness  Throat:   Lips, mucosa, and tongue normal; teeth and gums normal  Neck:   Supple, symmetrical, trachea midline, no adenopathy;    thyroid:  no enlargement/tenderness/nodules; no carotid   bruit or JVD  Back:     Symmetric, no curvature, ROM normal, no CVA tenderness  Lungs:     Clear to auscultation bilaterally, respirations unlabored  Chest Wall:    No tenderness or deformity   Heart:    Regular rate  and rhythm, S1 and S2 normal, no murmur, rub   or gallop  Breast Exam:    No tenderness, masses, or nipple abnormality  Abdomen:     Soft, non-tender, bowel sounds active all four quadrants,    no masses, no organomegaly  Genitalia:    Normal female without lesion; thin, white discharge   Extremities:   Extremities normal, atraumatic, no cyanosis or edema  Pulses:   2+ and symmetric all extremities  Skin:   Skin color, texture, turgor normal, no rashes or lesions  Lymph nodes:   Cervical, supraclavicular, and axillary nodes normal  Neurologic:   CNII-XII intact, normal strength, sensation and reflexes    throughout    Limited OB U/S: Cardiac activity x 2  Lab Review  Labs reviewed  yes Radiologic studies reviewed yes Assessment:    Pregnancy at [redacted]w[redacted]d weeks  ART/Donor Egg Twin gestation Pregnancy related nausea/vomiting   Plan:      Prenatal vitamins.  Counseling provided regarding continued use of seat belts, cessation of alcohol consumption, smoking or use of illicit drugs; infection precautions i.e., influenza/TDAP immunizations, toxoplasmosis,CMV, parvovirus, listeria and varicella; workplace safety, exercise during pregnancy; routine dental care, safe medications, sexual activity, hot tubs, saunas, pools, travel, caffeine use, fish and methlymercury, potential toxins, hair treatments, varicose veins Weight gain recommendations per IOM guidelines for twin gestations reviewed: 37 - 54 lbs Problem list reviewed and updated. FIRST/CF mutation testing/Spinal muscular atrophy discussed Role of ultrasound in pregnancy discussed Amniocentesis discussed: not indicated. Referral--> [facility] Carb modified diet Meds ordered this encounter  Medications  . estradiol (CLIMARA - DOSED IN MG/24 HR) 0.1 mg/24hr patch    Sig: Place 0.1 mg onto the skin once a week.  . progesterone (ENDOMETRIN) 100 MG vaginal insert    Sig: Place 100 mg vaginally 2 (two) times daily.  . progesterone (PROMETRIUM) 200 MG capsule    Sig: Take 200 mg by mouth daily.  Marland Kitchen estradiol (ESTRACE) 2 MG tablet    Sig: Take 2 mg by mouth 2 (two) times daily.  Marland Kitchen aspirin 81 MG tablet    Sig: Take 81 mg by mouth daily.  . folic acid (FOLVITE) 568 MCG tablet    Sig: Take 400 mcg by mouth daily.   Orders Placed This Encounter  Procedures  . Culture, OB Urine  . WET PREP BY MOLECULAR PROBE  . GC/Chlamydia Probe Amp  . Obstetric panel  . HIV antibody  . Hemoglobinopathy evaluation  . Varicella zoster antibody, IgG  . Vit D  25 hydroxy (rtn osteoporosis monitoring)  . Hemoglobin A1c  . CBC  . AMB Referral to Maternal Fetal Medicine (MFM)    Referral Priority:  Routine    Referral Type:  Consultation     Requested Specialty:  Perinatology    Number of Visits Requested:  1  . POCT urinalysis dipstick    Follow up in 2 weeks.

## 2014-04-16 LAB — OBSTETRIC PANEL
Antibody Screen: NEGATIVE
Basophils Absolute: 0 10*3/uL (ref 0.0–0.1)
Basophils Relative: 0 % (ref 0–1)
Eosinophils Absolute: 0.1 10*3/uL (ref 0.0–0.7)
Eosinophils Relative: 1 % (ref 0–5)
HCT: 42.7 % (ref 36.0–46.0)
HEMOGLOBIN: 14.4 g/dL (ref 12.0–15.0)
HEP B S AG: NEGATIVE
LYMPHS ABS: 1.6 10*3/uL (ref 0.7–4.0)
Lymphocytes Relative: 19 % (ref 12–46)
MCH: 30.1 pg (ref 26.0–34.0)
MCHC: 33.7 g/dL (ref 30.0–36.0)
MCV: 89.1 fL (ref 78.0–100.0)
MONOS PCT: 7 % (ref 3–12)
Monocytes Absolute: 0.6 10*3/uL (ref 0.1–1.0)
NEUTROS ABS: 6.2 10*3/uL (ref 1.7–7.7)
NEUTROS PCT: 73 % (ref 43–77)
Platelets: 462 10*3/uL — ABNORMAL HIGH (ref 150–400)
RBC: 4.79 MIL/uL (ref 3.87–5.11)
RDW: 15.1 % (ref 11.5–15.5)
RH TYPE: NEGATIVE
RUBELLA: 17.3 {index} — AB (ref <0.90)
WBC: 8.5 10*3/uL (ref 4.0–10.5)

## 2014-04-16 LAB — HEMOGLOBIN A1C
Hgb A1c MFr Bld: 5.6 % (ref <5.7)
Mean Plasma Glucose: 114 mg/dL (ref <117)

## 2014-04-16 LAB — VARICELLA ZOSTER ANTIBODY, IGG: VARICELLA IGG: 1375 {index} — AB (ref <135.00)

## 2014-04-16 LAB — WET PREP BY MOLECULAR PROBE
Candida species: NEGATIVE
Gardnerella vaginalis: NEGATIVE
Trichomonas vaginosis: NEGATIVE

## 2014-04-16 LAB — VITAMIN D 25 HYDROXY (VIT D DEFICIENCY, FRACTURES): Vit D, 25-Hydroxy: 28 ng/mL — ABNORMAL LOW (ref 30–89)

## 2014-04-16 LAB — HIV ANTIBODY (ROUTINE TESTING W REFLEX): HIV 1&2 Ab, 4th Generation: NONREACTIVE

## 2014-04-17 LAB — CULTURE, OB URINE
COLONY COUNT: NO GROWTH
ORGANISM ID, BACTERIA: NO GROWTH

## 2014-04-18 ENCOUNTER — Telehealth: Payer: Self-pay

## 2014-04-18 LAB — GC/CHLAMYDIA PROBE AMP
CT Probe RNA: NEGATIVE
GC PROBE AMP APTIMA: NEGATIVE

## 2014-04-18 NOTE — Telephone Encounter (Signed)
sch MFM, per Margreta Journey, ok with Dr. Delsa Sale - sch for 05/04/14 @ 1pm - patient was called 04/18/14 and is aware of appt and to arrive - 10-15 minutes early

## 2014-04-19 ENCOUNTER — Other Ambulatory Visit: Payer: Self-pay | Admitting: Obstetrics & Gynecology

## 2014-04-19 DIAGNOSIS — N63 Unspecified lump in unspecified breast: Secondary | ICD-10-CM

## 2014-04-19 LAB — HEMOGLOBINOPATHY EVALUATION
HEMOGLOBIN OTHER: 0 %
Hgb A2 Quant: 2.7 % (ref 2.2–3.2)
Hgb A: 97.3 % (ref 96.8–97.8)
Hgb F Quant: 0 % (ref 0.0–2.0)
Hgb S Quant: 0 %

## 2014-04-20 ENCOUNTER — Other Ambulatory Visit: Payer: Self-pay | Admitting: Obstetrics & Gynecology

## 2014-04-20 ENCOUNTER — Other Ambulatory Visit: Payer: Self-pay

## 2014-04-20 DIAGNOSIS — N63 Unspecified lump in unspecified breast: Secondary | ICD-10-CM

## 2014-04-21 ENCOUNTER — Other Ambulatory Visit: Payer: 59

## 2014-04-21 ENCOUNTER — Ambulatory Visit
Admission: RE | Admit: 2014-04-21 | Discharge: 2014-04-21 | Disposition: A | Discharge status: Home / Self Care | Payer: Commercial Managed Care - PPO | Source: Ambulatory Visit | Attending: Obstetrics & Gynecology | Admitting: Obstetrics & Gynecology

## 2014-04-21 DIAGNOSIS — N63 Unspecified lump in unspecified breast: Secondary | ICD-10-CM

## 2014-04-23 NOTE — Progress Notes (Signed)
Quick Note:  Needs PNV w/vitamin D ______

## 2014-05-02 ENCOUNTER — Ambulatory Visit (INDEPENDENT_AMBULATORY_CARE_PROVIDER_SITE_OTHER): Payer: Commercial Managed Care - PPO | Admitting: Obstetrics & Gynecology

## 2014-05-02 VITALS — BP 128/80 | HR 92 | Temp 98.3°F | Wt 152.0 lb

## 2014-05-02 DIAGNOSIS — Z3400 Encounter for supervision of normal first pregnancy, unspecified trimester: Secondary | ICD-10-CM

## 2014-05-02 DIAGNOSIS — Z3401 Encounter for supervision of normal first pregnancy, first trimester: Secondary | ICD-10-CM

## 2014-05-02 DIAGNOSIS — O30009 Twin pregnancy, unspecified number of placenta and unspecified number of amniotic sacs, unspecified trimester: Secondary | ICD-10-CM

## 2014-05-02 LAB — POCT URINALYSIS DIPSTICK
Bilirubin, UA: NEGATIVE
Blood, UA: 250
Glucose, UA: NEGATIVE
Ketones, UA: NEGATIVE
Leukocytes, UA: NEGATIVE
Nitrite, UA: NEGATIVE
Spec Grav, UA: 1.02
Urobilinogen, UA: NEGATIVE
pH, UA: 5

## 2014-05-02 MED ORDER — METOCLOPRAMIDE HCL 10 MG PO TABS: 10.0000 mg | ORAL_TABLET | Freq: Three times a day (TID) | ORAL | Status: DC | PRN

## 2014-05-02 NOTE — Patient Instructions (Signed)

## 2014-05-02 NOTE — Progress Notes (Signed)
Subjective:    Carrie Lang is being seen today for her obstetrical visit.  She is at [redacted]w[redacted]d gestation.  Patient reports heartburn and nausea.     Problem List Items Addressed This Visit   None    Visit Diagnoses   Encounter for supervision of normal first pregnancy in first trimester    -  Primary    Relevant Medications       metoclopramide (REGLAN) tablet    Other Relevant Orders       POCT urinalysis dipstick (Completed)      Patient Active Problem List   Diagnosis Date Noted  . Unspecified high-risk pregnancy 04/15/2014    Priority: High  . Twin gestation in first trimester 04/15/2014    Priority: High  . Rh negative status during pregnancy 04/15/2014    Priority: High  . Leiomyoma of uterus, unspecified 01/05/2014    Priority: High  . Primary female infertility 01/05/2014    Priority: High  . Acid reflux 04/15/2014  . Allergic rhinitis 04/15/2014  . HLD (hyperlipidemia) 04/15/2014  . Headache, migraine 04/15/2014    Objective:    BP 128/80  Pulse 92  Temp(Src) 98.3 F (36.8 C)  Wt 68.947 kg (152 lb)  LMP 02/08/2014 Uterine Size: consistent with twins     Assessment:    Pregnancy @ [redacted]w[redacted]d  weeks. Twins, dichorionic, diamniotic.   Plan:   Meds ordered this encounter  Medications  . metoCLOPramide (REGLAN) 10 MG tablet    Sig: Take 1 tablet (10 mg total) by mouth 3 (three) times daily as needed for nausea.    Dispense:  60 tablet    Refill:  2  . azelastine (OPTIVAR) 0.05 % ophthalmic solution    Sig: 0.005 %.  . baclofen (LIORESAL) 10 MG tablet    Sig: 2 mg.  . betamethasone valerate ointment (VALISONE) 0.1 %    Sig: Frequency:   Dosage:0   %  Instructions:  Note:APPLY SPARINGLY TO AFFECTED AREA(S) ONCE DAILY  . Ketoconazole 2 % GEL    Sig: Apply 2 % topically.    Problem list reviewed and updated. Labs reviewed. Role of ultrasound discussed; fetal survey recommended. Amniocentesis discussed: not indicated.  Nutrition in twin pregnancy  reviewed. Risks of twin pregnancy reviewed, including risk of prematurity, hypertension, diabetes and the possible need for stopping work early or limiting activities.   Follow-up in 2 weeks.

## 2014-05-02 NOTE — Progress Notes (Signed)
Patient reports she is doing well except for constipation

## 2014-05-04 ENCOUNTER — Ambulatory Visit: Payer: Commercial Managed Care - PPO

## 2014-05-04 DIAGNOSIS — Z3402 Encounter for supervision of normal first pregnancy, second trimester: Secondary | ICD-10-CM

## 2014-05-04 DIAGNOSIS — O09522 Supervision of elderly multigravida, second trimester: Secondary | ICD-10-CM | POA: Insufficient documentation

## 2014-05-04 LAB — CREATININE, SERUM: CREATININE: 0.53 mg/dL (ref 0.50–1.10)

## 2014-05-04 LAB — CBC
HEMATOCRIT: 39.1 % (ref 36.0–46.0)
HEMOGLOBIN: 13.6 g/dL (ref 12.0–15.0)
MCH: 29.9 pg (ref 26.0–34.0)
MCHC: 34.8 g/dL (ref 30.0–36.0)
MCV: 85.9 fL (ref 78.0–100.0)
Platelets: 472 10*3/uL — ABNORMAL HIGH (ref 150–400)
RBC: 4.55 MIL/uL (ref 3.87–5.11)
RDW: 15.5 % (ref 11.5–15.5)
WBC: 9.1 10*3/uL (ref 4.0–10.5)

## 2014-05-04 LAB — ALT: ALT: 45 U/L — ABNORMAL HIGH (ref 0–35)

## 2014-05-04 LAB — AST: AST: 27 U/L (ref 0–37)

## 2014-05-04 LAB — LACTATE DEHYDROGENASE: LDH: 185 U/L (ref 94–250)

## 2014-05-05 LAB — PATHOLOGIST SMEAR REVIEW

## 2014-05-10 ENCOUNTER — Other Ambulatory Visit: Payer: Self-pay | Admitting: *Deleted

## 2014-05-10 DIAGNOSIS — Z3482 Encounter for supervision of other normal pregnancy, second trimester: Secondary | ICD-10-CM

## 2014-05-10 MED ORDER — OB COMPLETE PETITE 35-5-1-200 MG PO CAPS: 1.0000 | ORAL_CAPSULE | Freq: Every day | ORAL | Status: DC

## 2014-05-11 ENCOUNTER — Ambulatory Visit: Payer: Commercial Managed Care - PPO

## 2014-05-11 DIAGNOSIS — N6459 Other signs and symptoms in breast: Secondary | ICD-10-CM | POA: Insufficient documentation

## 2014-05-11 DIAGNOSIS — Z3402 Encounter for supervision of normal first pregnancy, second trimester: Secondary | ICD-10-CM

## 2014-05-12 LAB — GLUCOSE TOLERANCE, 2 HOURS W/ 1HR
GLUCOSE, 2 HOUR: 124 mg/dL (ref 70–139)
GLUCOSE, FASTING: 79 mg/dL (ref 70–99)
GLUCOSE: 155 mg/dL (ref 70–170)

## 2014-05-17 ENCOUNTER — Other Ambulatory Visit: Payer: Self-pay | Admitting: Obstetrics & Gynecology

## 2014-05-17 ENCOUNTER — Ambulatory Visit (HOSPITAL_COMMUNITY)
Admission: RE | Admit: 2014-05-17 | Discharge: 2014-05-17 | Disposition: A | Discharge status: Home / Self Care | Payer: Commercial Managed Care - PPO | Source: Ambulatory Visit | Attending: Obstetrics & Gynecology | Admitting: Obstetrics & Gynecology

## 2014-05-17 ENCOUNTER — Encounter (HOSPITAL_COMMUNITY): Payer: Self-pay

## 2014-05-17 ENCOUNTER — Other Ambulatory Visit: Payer: Self-pay | Admitting: *Deleted

## 2014-05-17 VITALS — BP 113/76 | HR 103 | Wt 153.5 lb

## 2014-05-17 DIAGNOSIS — Z36 Encounter for antenatal screening of mother: Secondary | ICD-10-CM

## 2014-05-17 DIAGNOSIS — O30009 Twin pregnancy, unspecified number of placenta and unspecified number of amniotic sacs, unspecified trimester: Secondary | ICD-10-CM | POA: Diagnosis present

## 2014-05-17 DIAGNOSIS — O30049 Twin pregnancy, dichorionic/diamniotic, unspecified trimester: Secondary | ICD-10-CM | POA: Diagnosis not present

## 2014-05-17 DIAGNOSIS — Z3402 Encounter for supervision of normal first pregnancy, second trimester: Secondary | ICD-10-CM

## 2014-05-17 DIAGNOSIS — O30041 Twin pregnancy, dichorionic/diamniotic, first trimester: Secondary | ICD-10-CM

## 2014-05-17 DIAGNOSIS — O30002 Twin pregnancy, unspecified number of placenta and unspecified number of amniotic sacs, second trimester: Secondary | ICD-10-CM

## 2014-05-18 ENCOUNTER — Ambulatory Visit (INDEPENDENT_AMBULATORY_CARE_PROVIDER_SITE_OTHER): Payer: Commercial Managed Care - PPO | Admitting: Obstetrics & Gynecology

## 2014-05-18 ENCOUNTER — Encounter: Payer: Self-pay | Admitting: Obstetrics & Gynecology

## 2014-05-18 VITALS — BP 128/78 | HR 102 | Temp 98.2°F | Wt 152.0 lb

## 2014-05-18 DIAGNOSIS — O30049 Twin pregnancy, dichorionic/diamniotic, unspecified trimester: Secondary | ICD-10-CM

## 2014-05-18 DIAGNOSIS — Z23 Encounter for immunization: Secondary | ICD-10-CM

## 2014-05-18 DIAGNOSIS — O30042 Twin pregnancy, dichorionic/diamniotic, second trimester: Secondary | ICD-10-CM

## 2014-05-18 DIAGNOSIS — O30009 Twin pregnancy, unspecified number of placenta and unspecified number of amniotic sacs, unspecified trimester: Secondary | ICD-10-CM

## 2014-05-18 DIAGNOSIS — Z3400 Encounter for supervision of normal first pregnancy, unspecified trimester: Secondary | ICD-10-CM

## 2014-05-18 LAB — POCT URINALYSIS DIPSTICK
Bilirubin, UA: NEGATIVE
GLUCOSE UA: NEGATIVE
Ketones, UA: NEGATIVE
Leukocytes, UA: NEGATIVE
Nitrite, UA: NEGATIVE
RBC UA: 50
Spec Grav, UA: 1.025
Urobilinogen, UA: NEGATIVE
pH, UA: 5

## 2014-05-20 NOTE — Progress Notes (Signed)
Subjective:    Carrie Lang is being seen today for her obstetrical visit.  She is at [redacted]w[redacted]d gestation.  Patient reports heartburn and nausea.  She reports some improvement in her symptoms.  Problem List Items Addressed This Visit   None    Visit Diagnoses   Encounter for supervision of normal first pregnancy in second trimester    -  Primary    Relevant Orders       POCT urinalysis dipstick (Completed)    Need for immunization against influenza        Relevant Orders       Flu Vaccine QUAD 36+ mos IM (Fluarix) (Completed)      Patient Active Problem List   Diagnosis Date Noted  . Unspecified high-risk pregnancy 04/15/2014    Priority: High  . Twin gestation in first trimester 04/15/2014    Priority: High  . Rh negative status during pregnancy 04/15/2014    Priority: High  . Leiomyoma of uterus, unspecified 01/05/2014    Priority: High  . Primary female infertility 01/05/2014    Priority: High  . Acid reflux 04/15/2014  . Allergic rhinitis 04/15/2014  . HLD (hyperlipidemia) 04/15/2014  . Headache, migraine 04/15/2014    Objective:    BP 128/78  Pulse 102  Temp(Src) 98.2 F (36.8 C)  Wt 68.947 kg (152 lb)  LMP 02/08/2014 Uterine Size: consistent with twins     Assessment:    Pregnancy @ [redacted]w[redacted]d  weeks. Twins, dichorionic, diamniotic.   Plan:     Problem list reviewed and updated. Follow-up in 2 weeks.

## 2014-05-21 ENCOUNTER — Encounter: Payer: Self-pay | Admitting: Obstetrics & Gynecology

## 2014-05-22 ENCOUNTER — Encounter: Payer: Self-pay | Admitting: Obstetrics & Gynecology

## 2014-05-25 ENCOUNTER — Other Ambulatory Visit: Payer: Self-pay | Admitting: *Deleted

## 2014-05-25 DIAGNOSIS — O30002 Twin pregnancy, unspecified number of placenta and unspecified number of amniotic sacs, second trimester: Secondary | ICD-10-CM

## 2014-05-26 ENCOUNTER — Telehealth: Payer: Self-pay

## 2014-05-26 NOTE — Telephone Encounter (Signed)
atient has appt at [facility] for ultrasound at 10:15am on 10/14 - she knows she has appt at [facility] at 2:30pm and was fine with doing both same day - Rosa called her and let her know of appt and called me to let me know.

## 2014-06-01 ENCOUNTER — Telehealth: Payer: Self-pay

## 2014-06-01 ENCOUNTER — Ambulatory Visit (INDEPENDENT_AMBULATORY_CARE_PROVIDER_SITE_OTHER): Payer: Self-pay | Admitting: Obstetrics & Gynecology

## 2014-06-01 VITALS — BP 116/72 | HR 98 | Temp 98.2°F | Wt 156.0 lb

## 2014-06-01 DIAGNOSIS — Z3402 Encounter for supervision of normal first pregnancy, second trimester: Secondary | ICD-10-CM

## 2014-06-01 LAB — US OB COMP + 14 WK

## 2014-06-01 LAB — POCT URINALYSIS DIPSTICK
Bilirubin, UA: NEGATIVE
Blood, UA: NEGATIVE
GLUCOSE UA: NEGATIVE
Ketones, UA: NEGATIVE
Leukocytes, UA: NEGATIVE
Nitrite, UA: NEGATIVE
PROTEIN UA: NEGATIVE
SPEC GRAV UA: 1.02
Urobilinogen, UA: NEGATIVE
pH, UA: 5

## 2014-06-01 NOTE — Telephone Encounter (Signed)
PRINTED OUT MEDICAL RECORDS FOR PATIENT AND SHE TOOK WITH HER 06/01/14

## 2014-06-02 ENCOUNTER — Other Ambulatory Visit: Payer: Self-pay | Admitting: Obstetrics & Gynecology

## 2014-06-02 ENCOUNTER — Ambulatory Visit (HOSPITAL_COMMUNITY): Payer: Commercial Managed Care - PPO

## 2014-06-02 LAB — WET PREP BY MOLECULAR PROBE
Candida species: NEGATIVE
GARDNERELLA VAGINALIS: NEGATIVE
TRICHOMONAS VAG: NEGATIVE

## 2014-06-03 ENCOUNTER — Encounter: Payer: Self-pay | Admitting: *Deleted

## 2014-06-03 LAB — CULTURE, OB URINE
COLONY COUNT: NO GROWTH
ORGANISM ID, BACTERIA: NO GROWTH

## 2014-06-03 NOTE — Progress Notes (Signed)
Subjective:    Carrie Lang is being seen today for her obstetrical visit.  She is at [redacted]w[redacted]d gestation.  Patient reports no complaints.    Problem List Items Addressed This Visit   None    Visit Diagnoses   Encounter for supervision of normal first pregnancy in second trimester    -  Primary    Relevant Orders       POCT urinalysis dipstick (Completed)       Culture, OB Urine       WET PREP BY MOLECULAR PROBE (Completed)      Patient Active Problem List   Diagnosis Date Noted  . Unspecified high-risk pregnancy 04/15/2014    Priority: High  . Twin gestation in first trimester 04/15/2014    Priority: High  . Rh negative status during pregnancy 04/15/2014    Priority: High  . Leiomyoma of uterus, unspecified 01/05/2014    Priority: High  . Primary female infertility 01/05/2014    Priority: High  . Acid reflux 04/15/2014  . Allergic rhinitis 04/15/2014  . HLD (hyperlipidemia) 04/15/2014  . Headache, migraine 04/15/2014    Objective:    BP 116/72  Pulse 98  Temp(Src) 98.2 F (36.8 C)  Wt 70.761 kg (156 lb)  LMP 02/08/2014 FHT:  160 x 2  Uterine Size: Below the umbilicus     Assessment:    Pregnancy @ [redacted]w[redacted]d  Twins, dichorionic, diamniotic.   Plan:    Fetal survey discussed: ordered. Reviewed signs and symptoms of premature labor and PROM.   Discussed the potential for activity modification and planning for maternity leave.

## 2014-06-06 NOTE — Progress Notes (Signed)
Patient was given copy before she left the country.

## 2014-06-20 ENCOUNTER — Encounter: Payer: Self-pay | Admitting: Obstetrics & Gynecology

## 2014-08-15 ENCOUNTER — Encounter: Payer: Self-pay | Admitting: *Deleted

## 2014-08-16 ENCOUNTER — Encounter: Payer: Self-pay | Admitting: Obstetrics & Gynecology

## 2015-02-18 ENCOUNTER — Encounter (HOSPITAL_COMMUNITY): Payer: Self-pay | Admitting: *Deleted

## 2015-12-29 IMAGING — MG MM SCREENING BREAST TOMO BILATERAL
9 of 12 series · 9 of 28 positions shown · non-contrast
Comparison: Previous Exam(s)

CLINICAL DATA: Screening.

EXAM:
DIGITAL SCREENING BILATERAL MAMMOGRAM WITH 3D TOMO WITH CAD
DIGITAL BREAST TOMOSYNTHESIS Digital breast tomosynthesis images are
acquired in two projections. These images are reviewed in
combination with the digital mammogram, confirming the findings
below.

[R CC synth-2D]
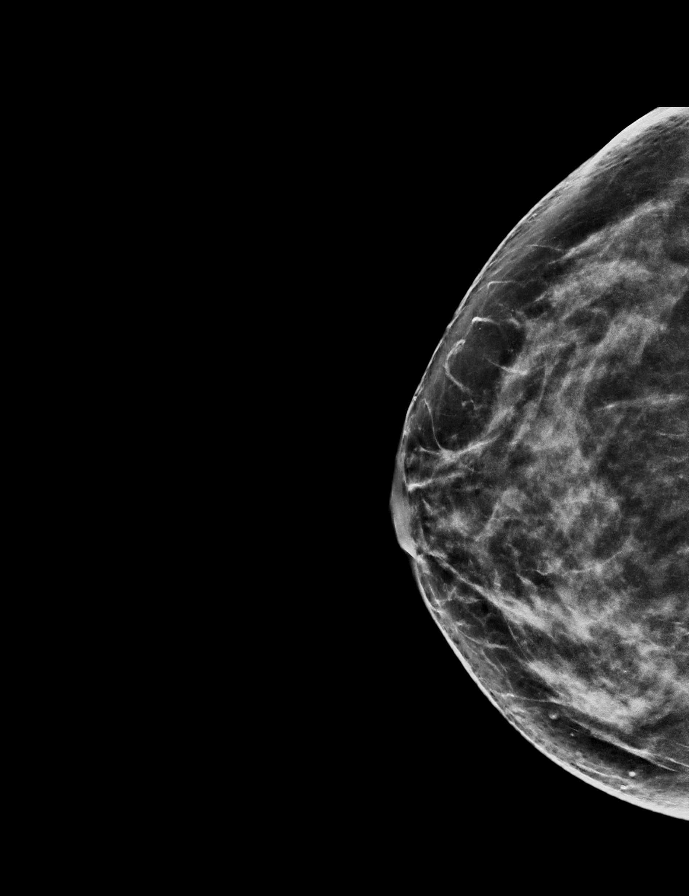

[L MLO synth-2D]
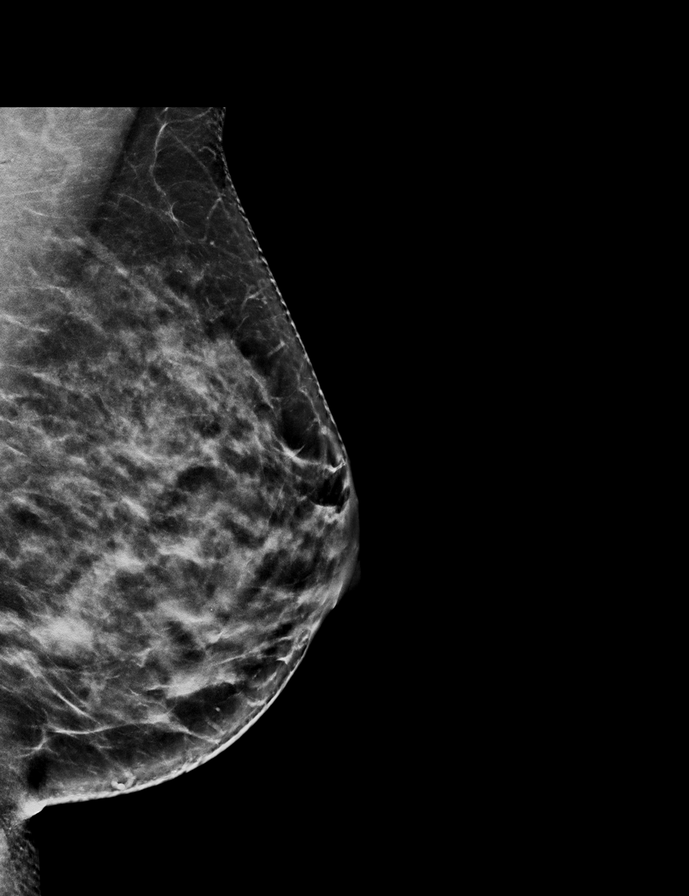

[R MLO]
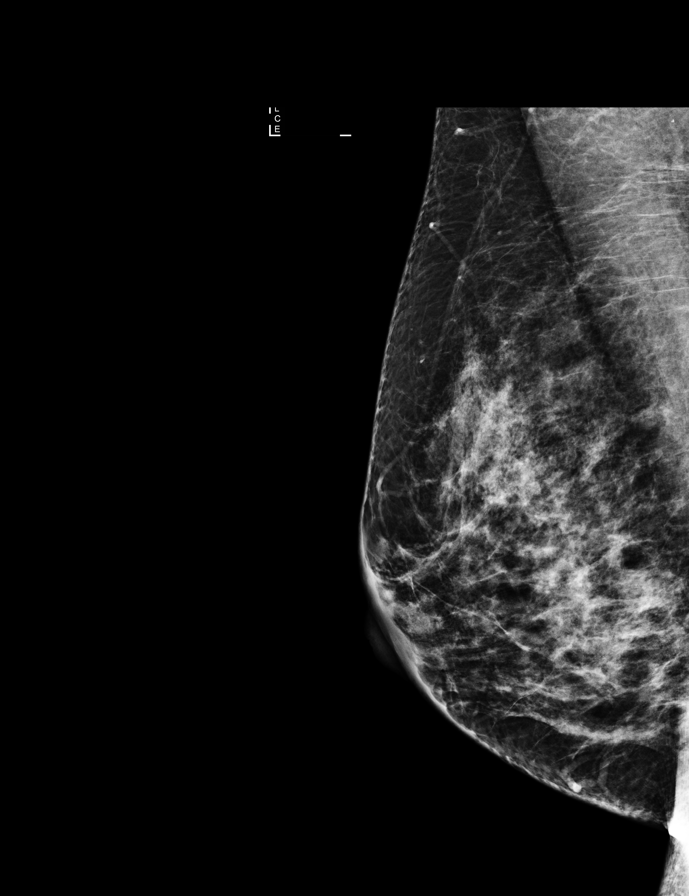

[R CC]
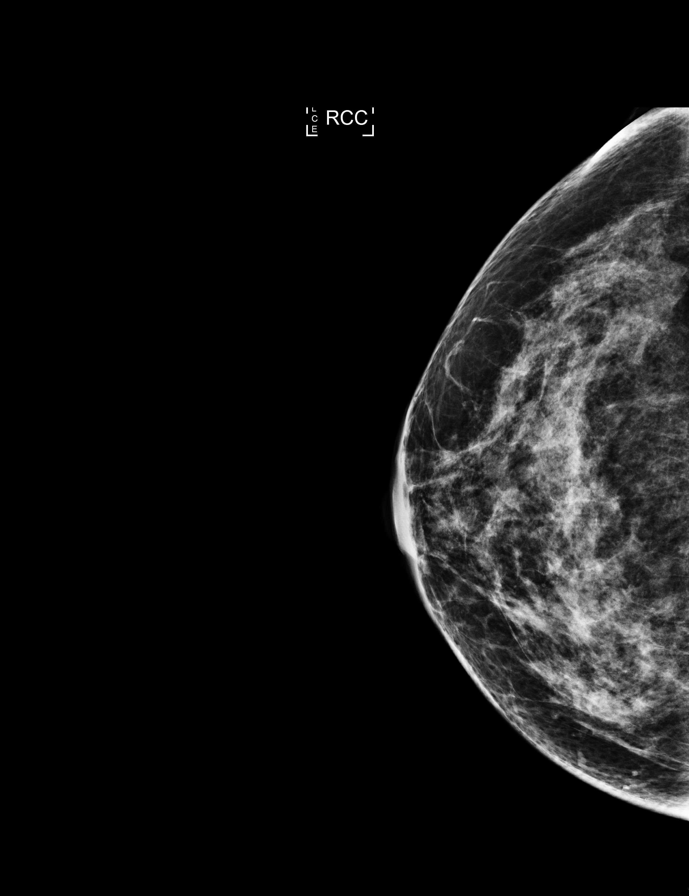

[L CC]
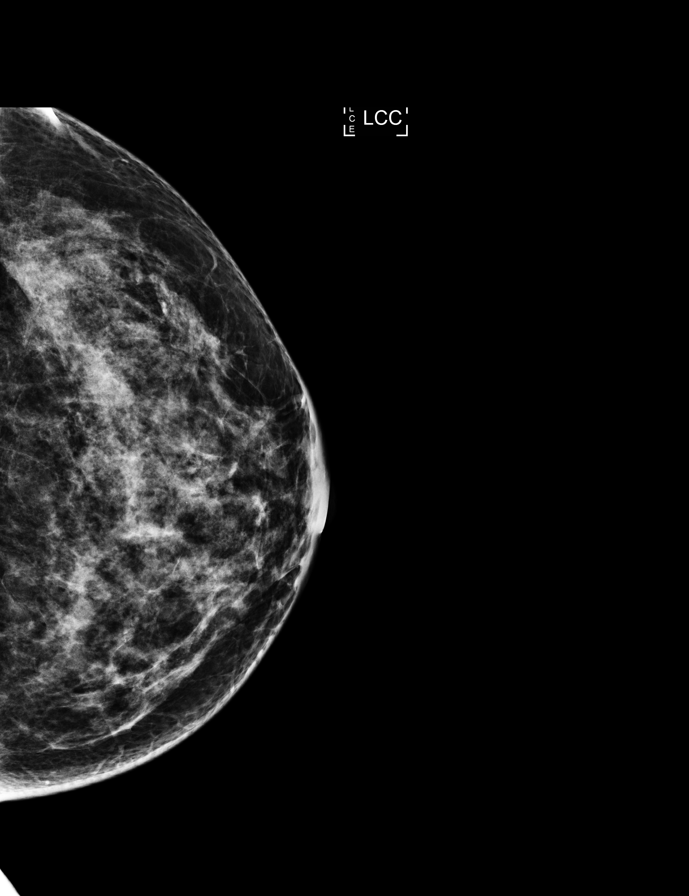

[R MLO synth-2D]
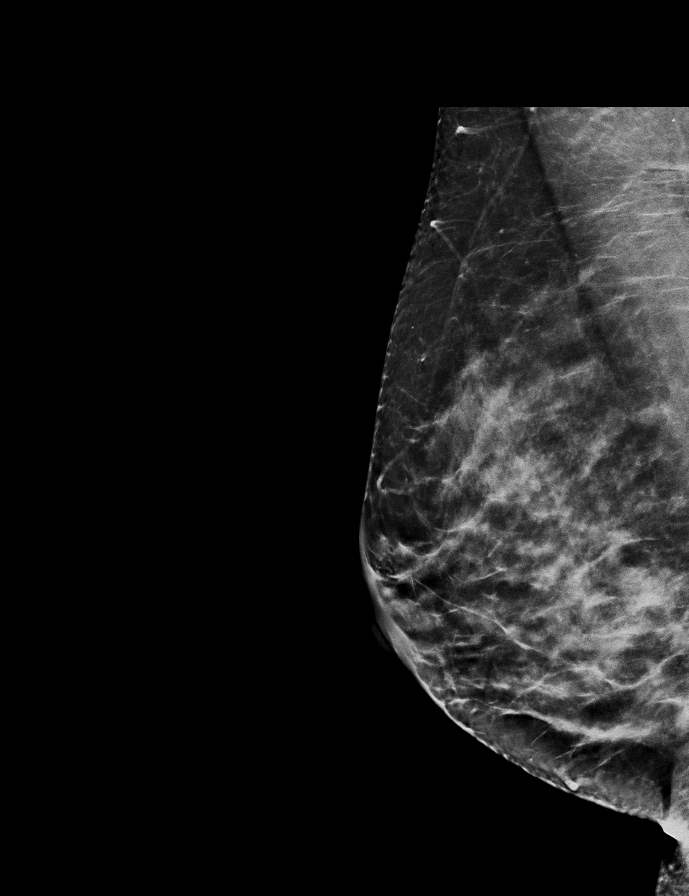

[L CC synth-2D]
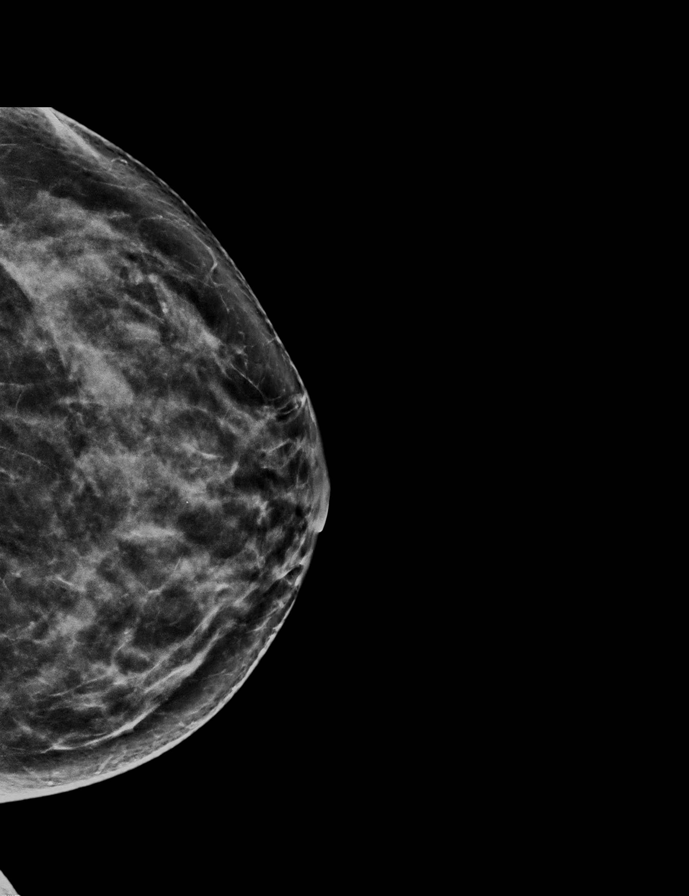

[L MLO]
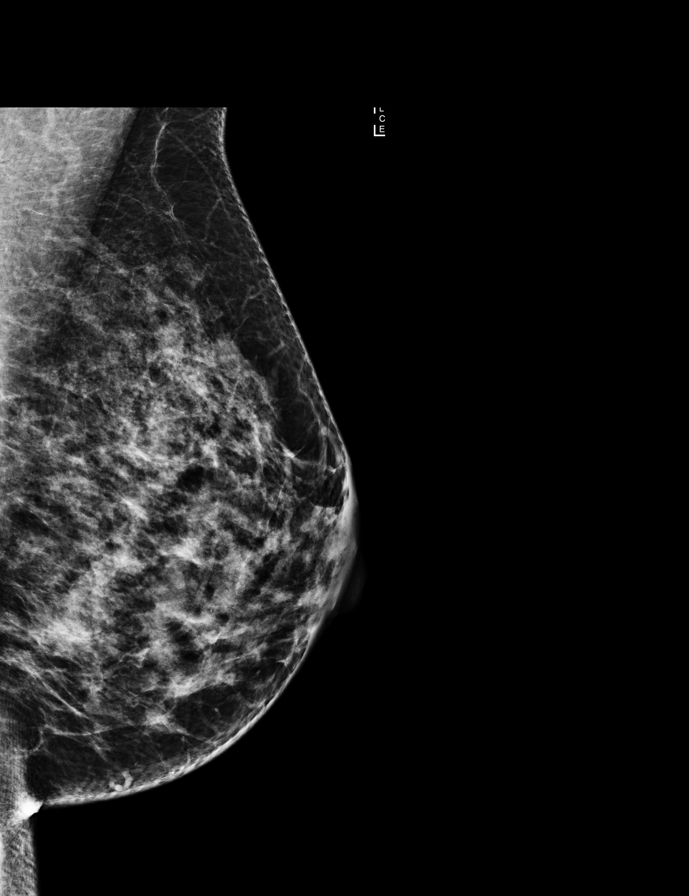

[R MLO tomo · tomo slice 35/69.0]
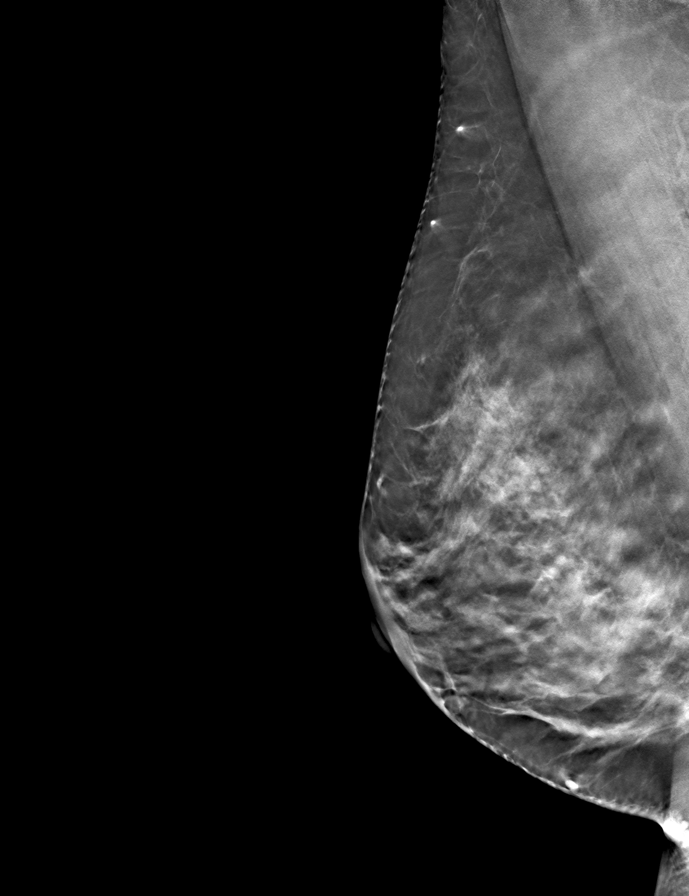

[9 of 28 positions shown; findings below may reference images not displayed]

ACR Breast Density Category c: The breast tissue is heterogeneously
dense, which may obscure small masses.
FINDINGS: In the left breast, possible distortion warrants further evaluation
with spot compression views and possibly ultrasound. In the right
breast, no findings suspicious for malignancy. Images were processed
with CAD.
IMPRESSION: Further evaluation is suggested for possible distortion in the left
breast.

RECOMMENDATION:
Diagnostic mammogram and possibly ultrasound of the left breast.
(Code:AX-I-WWH)

The patient will be contacted regarding the findings, and additional
imaging will be scheduled.

BI-RADS CATEGORY  0: Incomplete. Need additional imaging evaluation
and/or prior mammograms for comparison.

## 2016-03-02 IMAGING — US SECURVIEW GSPS
1 series · 8 of 8 positions shown · non-contrast
Comparison: 02/25/2014 mammogram and ultrasound.

ADDENDUM:
Final pathology demonstrates SCLEROSING PAPILLOMA WITH FLORID
HYPERPLASIA. No evidence of atypia or malignancy.

Histology correlates with imaging findings.
The patient was contacted by phone on 04/22/2014 and 04/26/2014 and
these results given to her which she understood. Her questions were
answered.
The patient had no complaints with her biopsy site...
Recommend surgical consultation for possible excision. An
appointment with Dr. Xavier ([REDACTED]) has been
scheduled for 05/09/2014 and the patient informed..
CLINICAL DATA: 47-year-old female with indeterminate 7 x 9 mm
hypoechoic area within the outer left breast.
EXAM:
ULTRASOUND GUIDED LEFT BREAST CORE NEEDLE BIOPSY WITH VACUUM ASSIST

[Series 1: superficial breast · 8 of 8 slices shown]
[im 1/8]
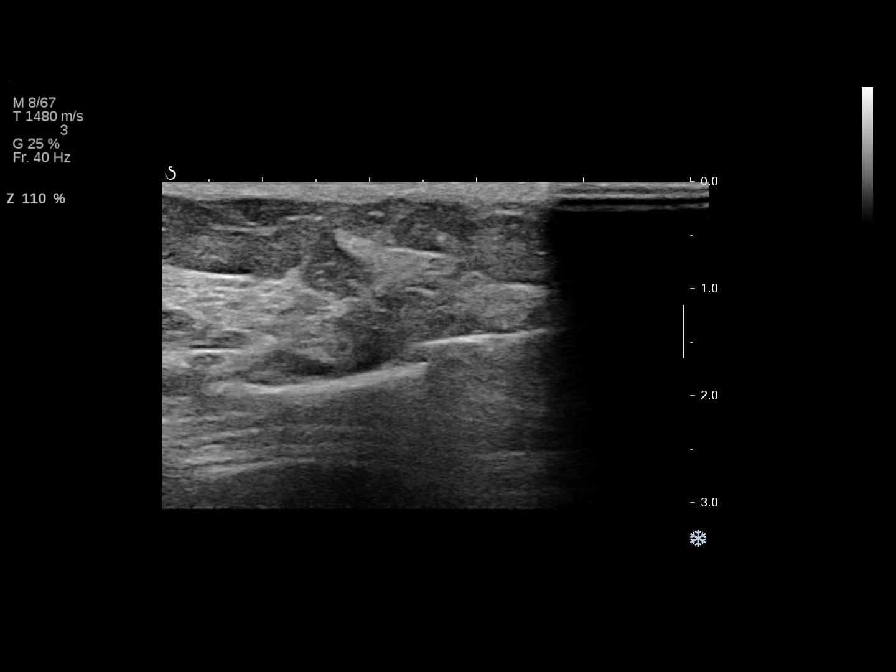
[im 2/8]
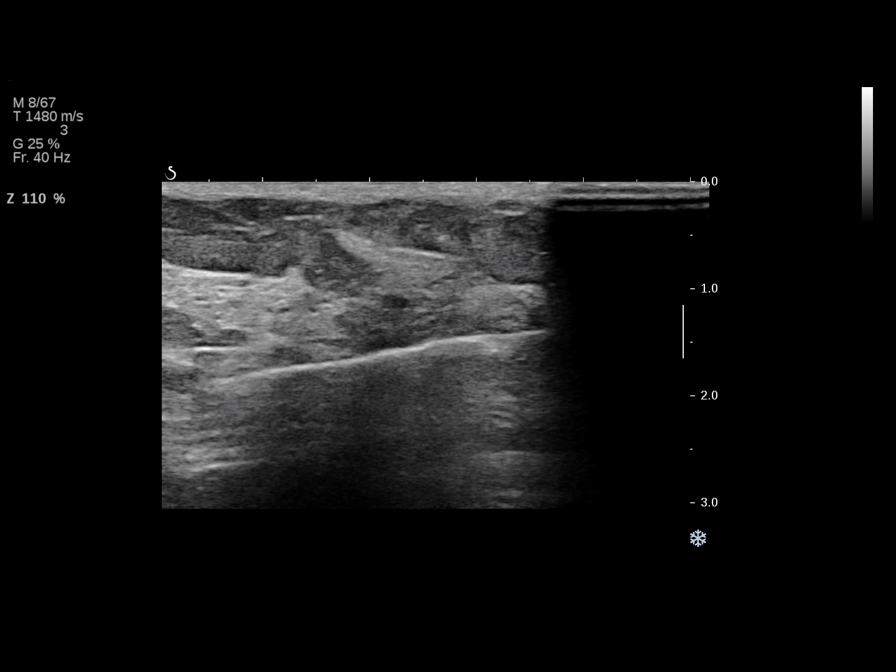
[im 3/8]
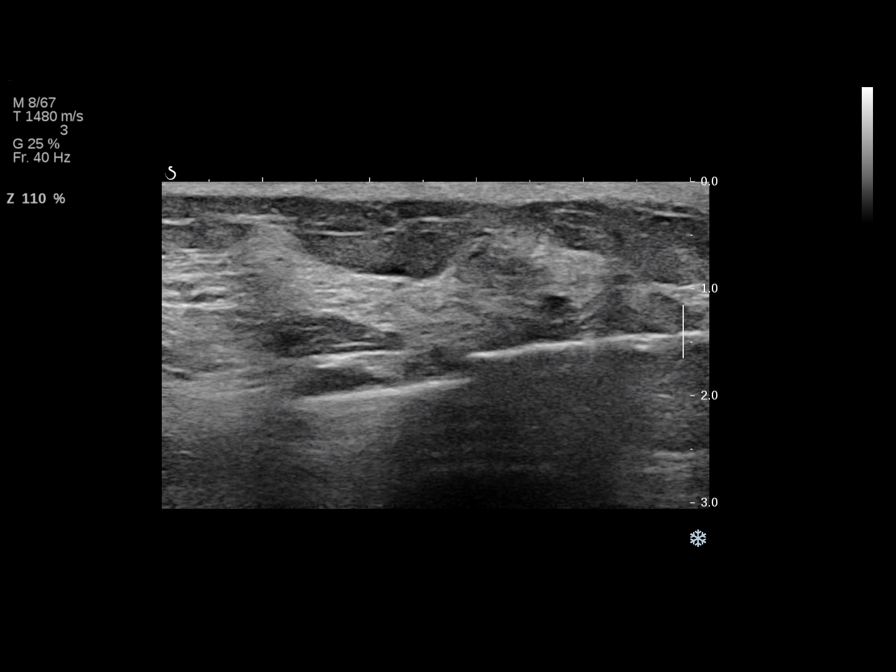
[im 4/8]
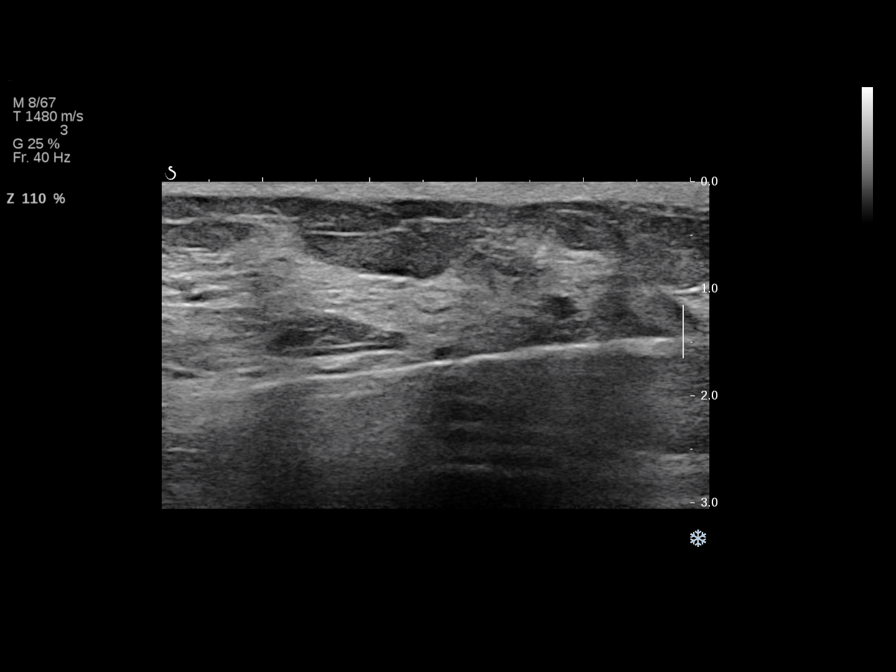
[im 5/8]
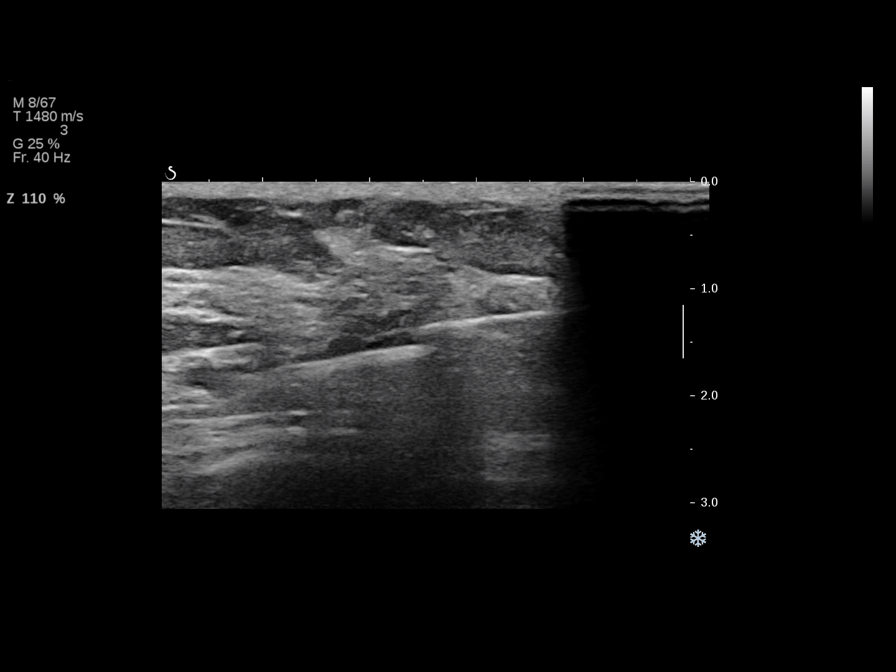
[im 6/8]
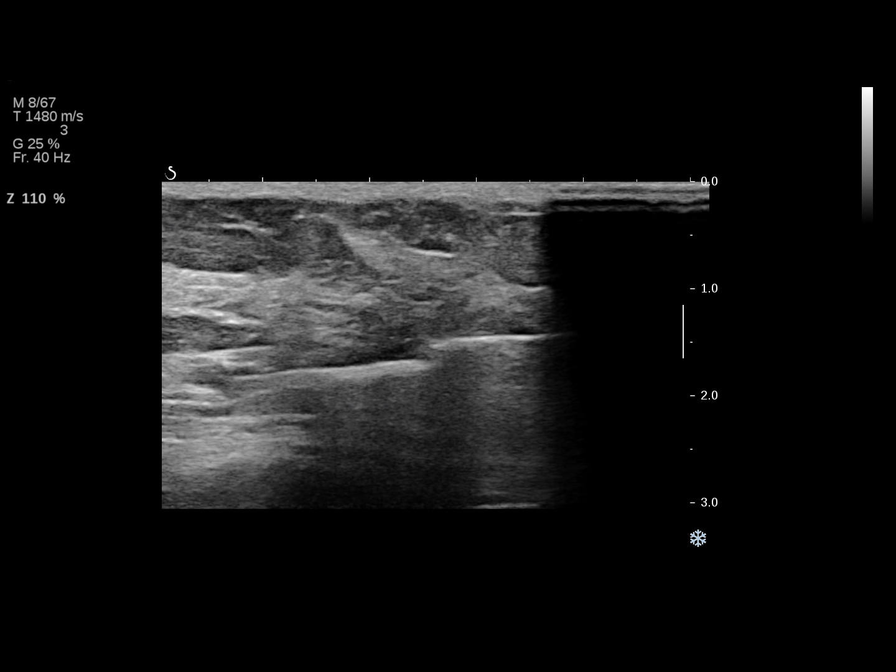
[im 7/8]
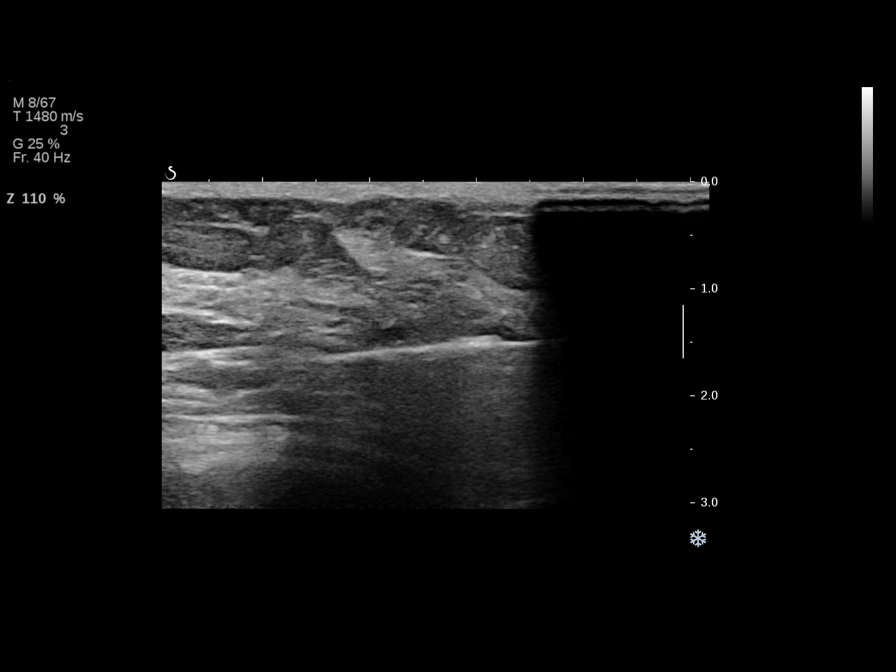
[im 8/8]
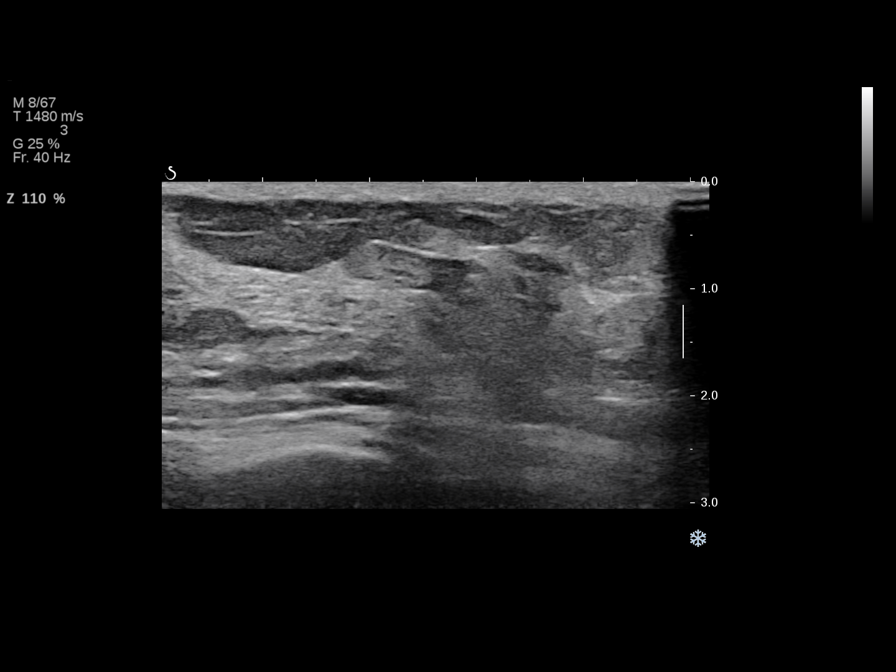

[8 of 8 positions shown; findings below may reference images not displayed]

PROCEDURE:
I met with the patient and we discussed the procedure of
ultrasound-guided biopsy, including benefits and alternatives. We
discussed the high likelihood of a successful procedure. We
discussed the risks of the procedure including infection, bleeding,
tissue injury, clip migration, and inadequate sampling. Informed
written consent was given. The usual time-out protocol was performed
immediately prior to the procedure.

Using sterile technique and 2% Lidocaine as local anesthetic, under
direct ultrasound visualization, a 12 gauge vacuum-assisteddevice
was used to perform biopsy of the 7 x 9 mm deep hypoechoic area at
the [DATE] position of the left breast 6 cm from the nipple using a
lateral approach. At the conclusion of the procedure, a ribbon
shaped tissue marker clip was deployed into the biopsy cavity.
Follow-up 2-view mammogram was performed and dictated separately.
IMPRESSION: Ultrasound-guided biopsy of left breast abnormality. No apparent
complications.

Pathology will be followed.

## 2016-03-02 IMAGING — MG MM DIAGNOSTIC UNILATERAL L
2 series · 2 of 2 positions shown · non-contrast
Comparison: Previous exams

CLINICAL DATA: 47-year-old female -evaluate clip placement
following ultrasound-guided left breast biopsy.

EXAM:
DIAGNOSTIC LEFT MAMMOGRAM POST ULTRASOUND BIOPSY

[L CC]
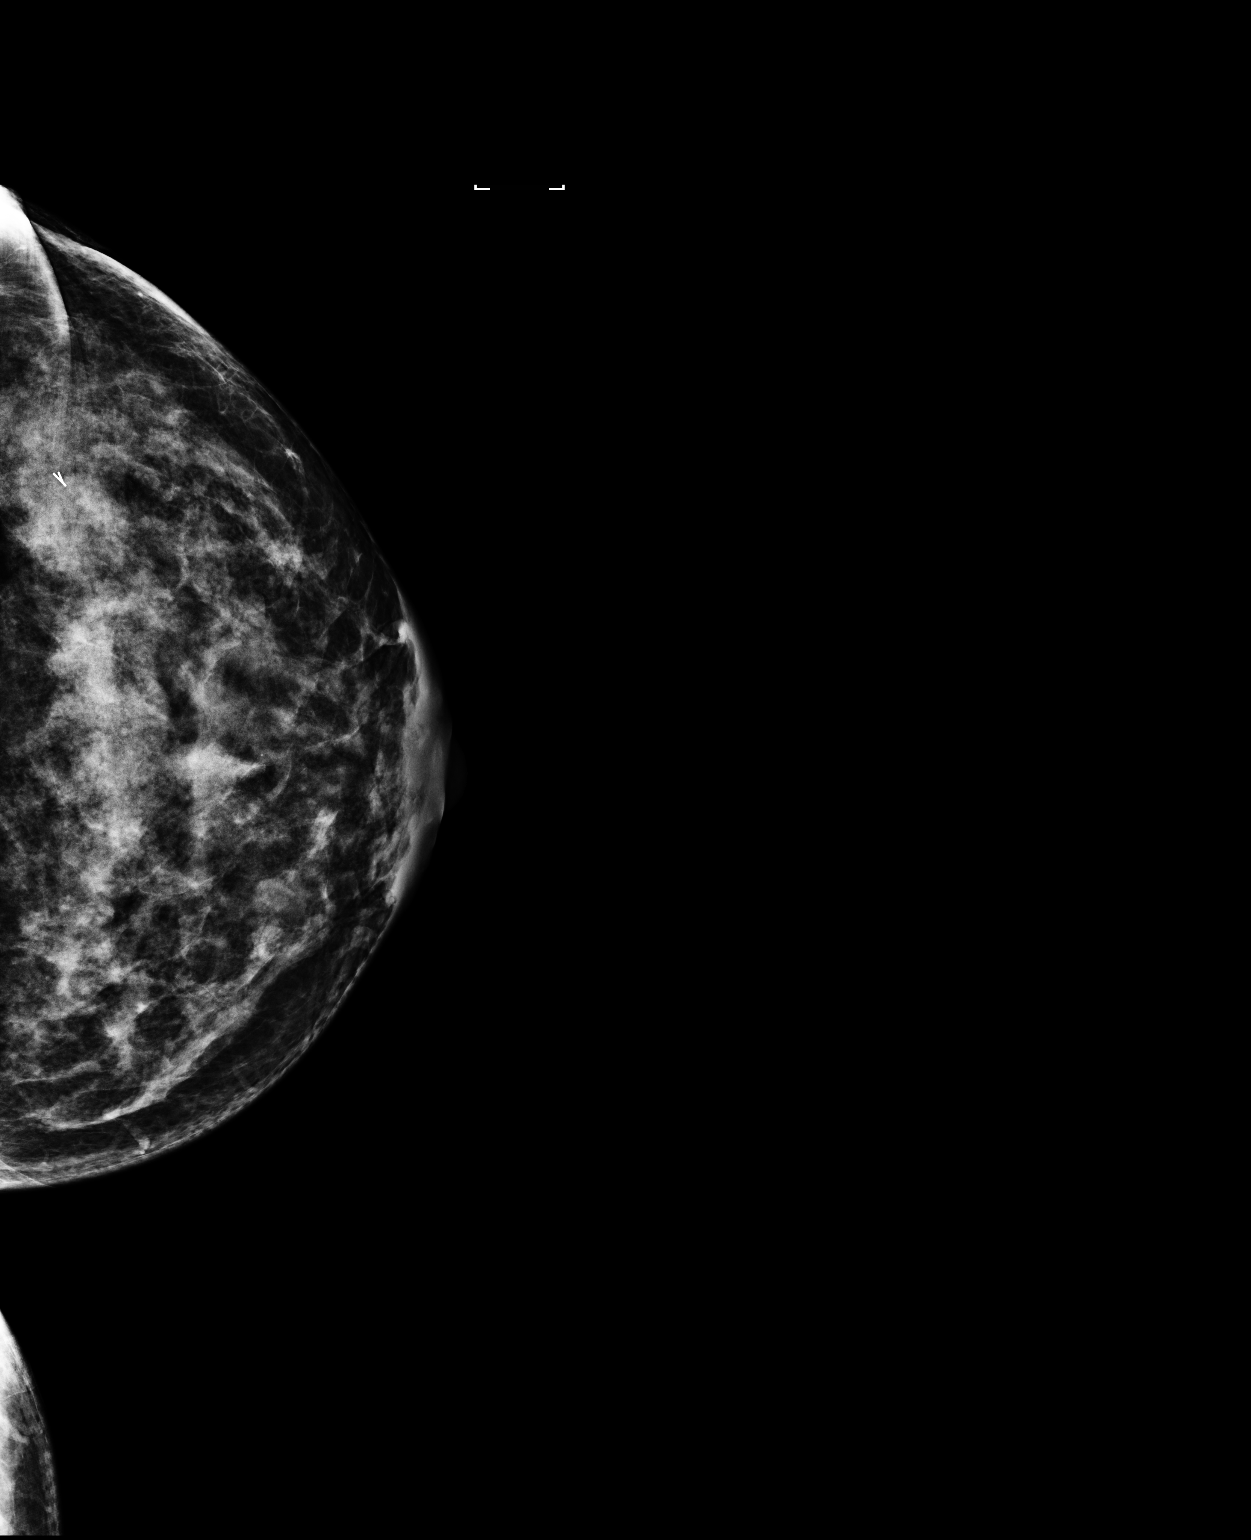

[L ML]
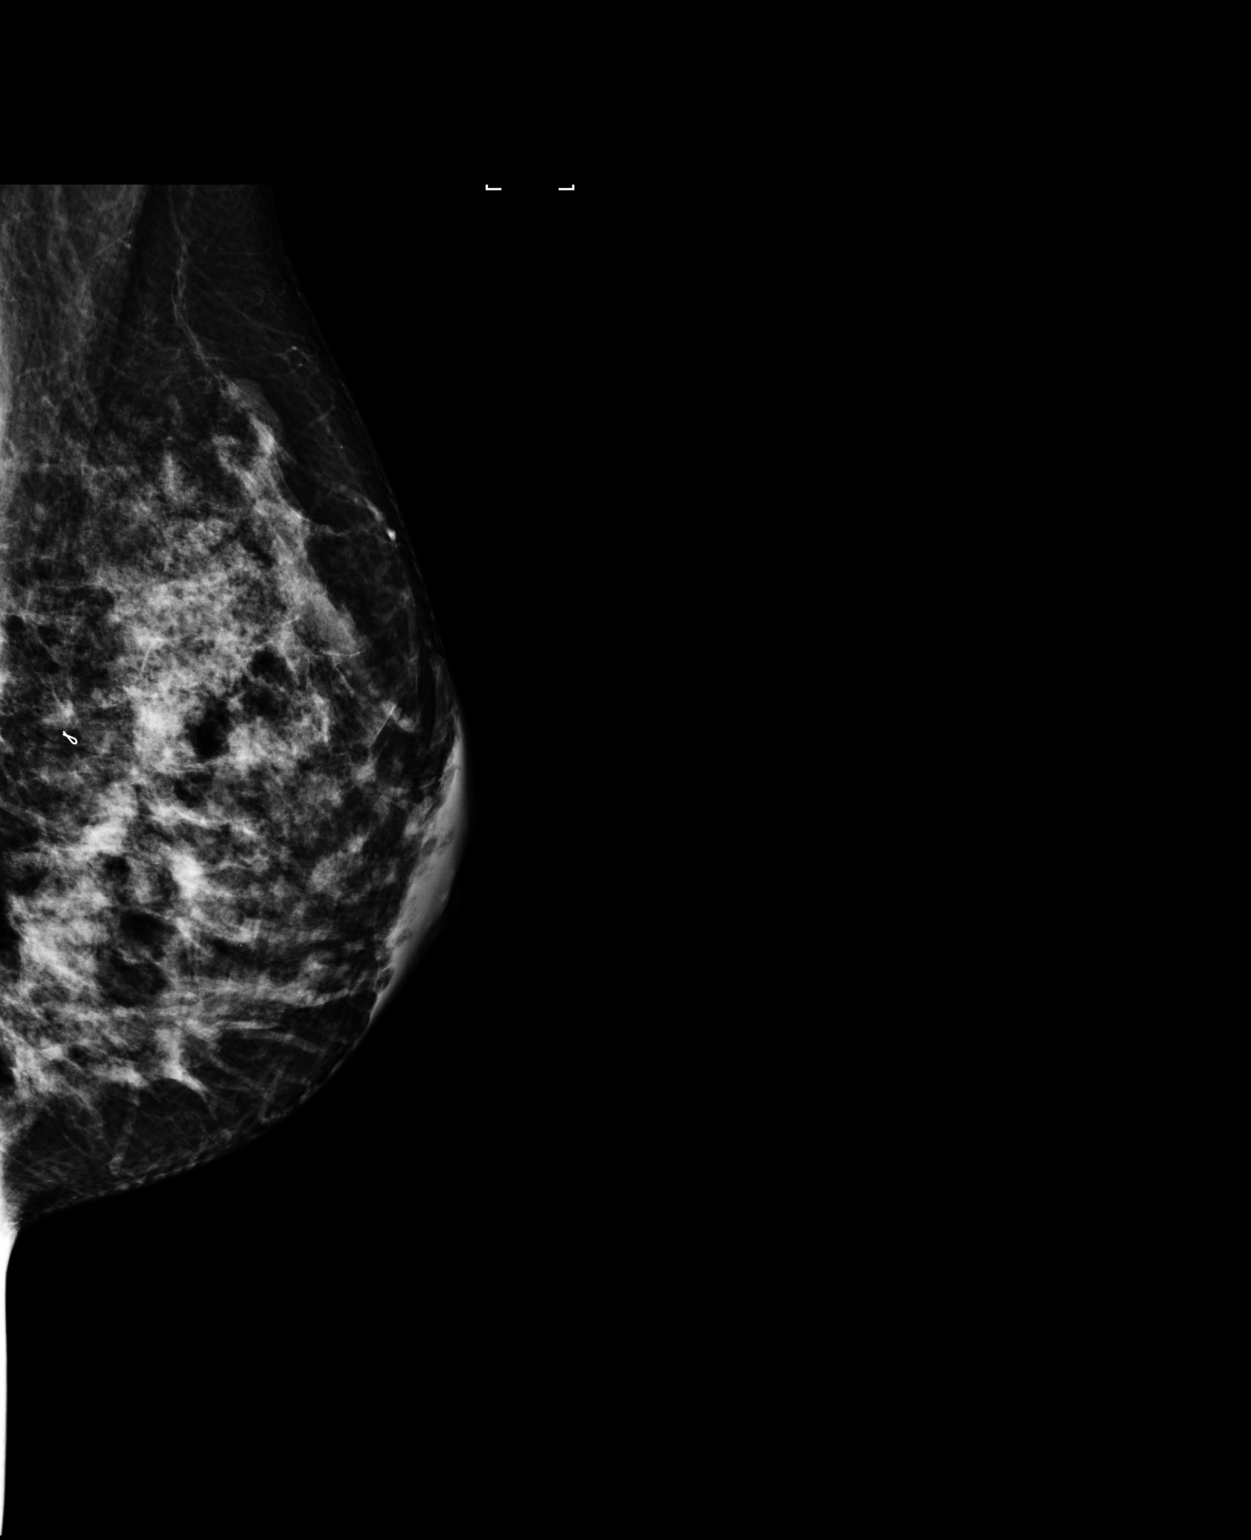

[2 of 2 positions shown; findings below may reference images not displayed]

FINDINGS: Mammographic images were obtained following ultrasound guided biopsy
of the 7 x 9 mm hypoechoic area at the [DATE] position of the left
breast 6 cm from the nipple.

The biopsy clip is in satisfactory position.

However please note that the biopsy clip does not correspond to the
questioned area of distortion on the screening mammogram.
IMPRESSION: Satisfactory clip position following ultrasound-guided left breast
biopsy. Please note that the clip does not correspond to the
questioned area of distortion on the screening mammogram, but does
correspond to the sonographic abnormality.

Final Assessment: Post Procedure Mammograms for Marker Placement

## 2016-05-07 ENCOUNTER — Other Ambulatory Visit: Payer: Self-pay | Admitting: Family Medicine

## 2016-05-07 DIAGNOSIS — N6320 Unspecified lump in the left breast, unspecified quadrant: Secondary | ICD-10-CM

## 2016-05-20 ENCOUNTER — Ambulatory Visit
Admission: RE | Admit: 2016-05-20 | Discharge: 2016-05-20 | Disposition: A | Discharge status: Home / Self Care | Payer: Managed Care, Other (non HMO) | Source: Ambulatory Visit | Attending: Family Medicine | Admitting: Family Medicine

## 2016-05-20 ENCOUNTER — Other Ambulatory Visit: Payer: Self-pay | Admitting: Family Medicine

## 2016-05-20 DIAGNOSIS — N6320 Unspecified lump in the left breast, unspecified quadrant: Secondary | ICD-10-CM

## 2016-05-27 ENCOUNTER — Ambulatory Visit
Admission: RE | Admit: 2016-05-27 | Discharge: 2016-05-27 | Disposition: A | Discharge status: Home / Self Care | Payer: Managed Care, Other (non HMO) | Source: Ambulatory Visit | Attending: Family Medicine | Admitting: Family Medicine

## 2016-05-27 ENCOUNTER — Other Ambulatory Visit: Payer: Self-pay | Admitting: Family Medicine

## 2016-05-27 DIAGNOSIS — N6320 Unspecified lump in the left breast, unspecified quadrant: Secondary | ICD-10-CM

## 2016-06-03 ENCOUNTER — Other Ambulatory Visit: Payer: Self-pay | Admitting: General Surgery

## 2016-06-03 DIAGNOSIS — N6489 Other specified disorders of breast: Secondary | ICD-10-CM

## 2016-06-13 DIAGNOSIS — M67432 Ganglion, left wrist: Secondary | ICD-10-CM | POA: Insufficient documentation

## 2016-06-26 ENCOUNTER — Encounter (HOSPITAL_BASED_OUTPATIENT_CLINIC_OR_DEPARTMENT_OTHER): Payer: Self-pay | Admitting: *Deleted

## 2016-06-26 NOTE — Progress Notes (Signed)
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 States she will bring when she comes for boost drink.

## 2016-07-01 ENCOUNTER — Ambulatory Visit
Admission: RE | Admit: 2016-07-01 | Discharge: 2016-07-01 | Disposition: A | Discharge status: Home / Self Care | Payer: Managed Care, Other (non HMO) | Source: Ambulatory Visit | Attending: General Surgery | Admitting: General Surgery

## 2016-07-01 DIAGNOSIS — N6489 Other specified disorders of breast: Secondary | ICD-10-CM

## 2016-07-01 NOTE — Progress Notes (Signed)
Pt given 8 oz carton of boost breeze with verbal and written instructions to drink by 530 am day of surgery,  Nothing else after midnight, teach back, pt voiced understanding

## 2016-07-02 DIAGNOSIS — F419 Anxiety disorder, unspecified: Secondary | ICD-10-CM | POA: Insufficient documentation

## 2016-07-03 ENCOUNTER — Other Ambulatory Visit: Payer: Self-pay | Admitting: General Surgery

## 2016-07-04 ENCOUNTER — Ambulatory Visit: Admit: 2016-07-04 | Payer: Managed Care, Other (non HMO)

## 2016-07-04 ENCOUNTER — Ambulatory Visit (HOSPITAL_BASED_OUTPATIENT_CLINIC_OR_DEPARTMENT_OTHER): Payer: Managed Care, Other (non HMO) | Admitting: Anesthesiology

## 2016-07-04 ENCOUNTER — Ambulatory Visit
Admit: 2016-07-04 | Discharge: 2016-07-04 | Disposition: A | Discharge status: Home / Self Care | Payer: Managed Care, Other (non HMO) | Attending: General Surgery | Admitting: General Surgery

## 2016-07-04 ENCOUNTER — Ambulatory Visit (HOSPITAL_BASED_OUTPATIENT_CLINIC_OR_DEPARTMENT_OTHER)
Admission: RE | Admit: 2016-07-04 | Discharge: 2016-07-04 | Disposition: A | Discharge status: Home / Self Care | Payer: Managed Care, Other (non HMO) | Source: Ambulatory Visit | Attending: General Surgery | Admitting: General Surgery

## 2016-07-04 ENCOUNTER — Encounter (HOSPITAL_BASED_OUTPATIENT_CLINIC_OR_DEPARTMENT_OTHER)
Admission: RE | Disposition: A | Discharge status: Home / Self Care | Payer: Self-pay | Source: Ambulatory Visit | Attending: General Surgery

## 2016-07-04 ENCOUNTER — Encounter (HOSPITAL_BASED_OUTPATIENT_CLINIC_OR_DEPARTMENT_OTHER): Payer: Self-pay | Admitting: Certified Registered"

## 2016-07-04 DIAGNOSIS — K219 Gastro-esophageal reflux disease without esophagitis: Secondary | ICD-10-CM | POA: Diagnosis not present

## 2016-07-04 DIAGNOSIS — E785 Hyperlipidemia, unspecified: Secondary | ICD-10-CM | POA: Diagnosis not present

## 2016-07-04 DIAGNOSIS — D242 Benign neoplasm of left breast: Secondary | ICD-10-CM | POA: Diagnosis not present

## 2016-07-04 DIAGNOSIS — N6320 Unspecified lump in the left breast, unspecified quadrant: Secondary | ICD-10-CM

## 2016-07-04 DIAGNOSIS — N6489 Other specified disorders of breast: Secondary | ICD-10-CM | POA: Diagnosis present

## 2016-07-04 HISTORY — DX: Benign neoplasm of left breast: D24.2

## 2016-07-04 HISTORY — PX: RADIOACTIVE SEED GUIDED EXCISIONAL BREAST BIOPSY: SHX6490

## 2016-07-04 SURGERY — RADIOACTIVE SEED GUIDED BREAST BIOPSY
Anesthesia: General | Site: Breast | Laterality: Left

## 2016-07-04 MED ORDER — FENTANYL CITRATE (PF) 100 MCG/2ML IJ SOLN
25.0000 ug | INTRAMUSCULAR | Status: DC | PRN
  Administered 2016-07-04 (×2): 50 ug via INTRAVENOUS

## 2016-07-04 MED ORDER — LIDOCAINE 2% (20 MG/ML) 5 ML SYRINGE
INTRAMUSCULAR | Status: DC | PRN
  Administered 2016-07-04: 60 mg via INTRAVENOUS

## 2016-07-04 MED ORDER — DEXAMETHASONE SODIUM PHOSPHATE 4 MG/ML IJ SOLN
INTRAMUSCULAR | Status: DC | PRN
  Administered 2016-07-04: 10 mg via INTRAVENOUS

## 2016-07-04 MED ORDER — MIDAZOLAM HCL 2 MG/2ML IJ SOLN
1.0000 mg | INTRAMUSCULAR | Status: DC | PRN
  Administered 2016-07-04: 2 mg via INTRAVENOUS

## 2016-07-04 MED ORDER — LACTATED RINGERS IV SOLN: INTRAVENOUS | Status: DC

## 2016-07-04 MED ORDER — PROPOFOL 10 MG/ML IV BOLUS
INTRAVENOUS | Status: DC | PRN
  Administered 2016-07-04: 160 mg via INTRAVENOUS

## 2016-07-04 MED ORDER — LIDOCAINE 2% (20 MG/ML) 5 ML SYRINGE
INTRAMUSCULAR | Status: AC
  Filled 2016-07-04: qty 5

## 2016-07-04 MED ORDER — CEFAZOLIN SODIUM-DEXTROSE 2-4 GM/100ML-% IV SOLN
INTRAVENOUS | Status: AC
  Filled 2016-07-04: qty 100

## 2016-07-04 MED ORDER — ONDANSETRON HCL 4 MG/2ML IJ SOLN
INTRAMUSCULAR | Status: AC
  Filled 2016-07-04: qty 2

## 2016-07-04 MED ORDER — ACETAMINOPHEN 500 MG PO TABS
1000.0000 mg | ORAL_TABLET | ORAL | Status: AC
  Administered 2016-07-04: 1000 mg via ORAL

## 2016-07-04 MED ORDER — METOCLOPRAMIDE HCL 5 MG/ML IJ SOLN: 10.0000 mg | Freq: Once | INTRAMUSCULAR | Status: DC | PRN

## 2016-07-04 MED ORDER — FENTANYL CITRATE (PF) 100 MCG/2ML IJ SOLN
INTRAMUSCULAR | Status: AC
  Filled 2016-07-04: qty 2

## 2016-07-04 MED ORDER — ATROPINE SULFATE 0.4 MG/ML IJ SOLN
INTRAMUSCULAR | Status: AC
  Filled 2016-07-04: qty 1

## 2016-07-04 MED ORDER — DEXAMETHASONE SODIUM PHOSPHATE 10 MG/ML IJ SOLN
INTRAMUSCULAR | Status: AC
  Filled 2016-07-04: qty 1

## 2016-07-04 MED ORDER — HYDROCODONE-ACETAMINOPHEN 10-325 MG PO TABS: 1.0000 | ORAL_TABLET | Freq: Four times a day (QID) | ORAL | 0 refills | Status: DC | PRN

## 2016-07-04 MED ORDER — ONDANSETRON HCL 4 MG/2ML IJ SOLN
INTRAMUSCULAR | Status: DC | PRN
  Administered 2016-07-04: 4 mg via INTRAVENOUS

## 2016-07-04 MED ORDER — CEFAZOLIN SODIUM-DEXTROSE 2-4 GM/100ML-% IV SOLN
2.0000 g | INTRAVENOUS | Status: AC
  Administered 2016-07-04: 2 g via INTRAVENOUS

## 2016-07-04 MED ORDER — FENTANYL CITRATE (PF) 100 MCG/2ML IJ SOLN
50.0000 ug | INTRAMUSCULAR | Status: DC | PRN
  Administered 2016-07-04 (×2): 50 ug via INTRAVENOUS

## 2016-07-04 MED ORDER — MEPERIDINE HCL 25 MG/ML IJ SOLN: 6.2500 mg | INTRAMUSCULAR | Status: DC | PRN

## 2016-07-04 MED ORDER — PROPOFOL 500 MG/50ML IV EMUL
INTRAVENOUS | Status: AC
Start: 2016-07-04 — End: 2016-07-04
  Filled 2016-07-04: qty 50

## 2016-07-04 MED ORDER — GABAPENTIN 300 MG PO CAPS
300.0000 mg | ORAL_CAPSULE | ORAL | Status: AC
  Administered 2016-07-04: 300 mg via ORAL

## 2016-07-04 MED ORDER — BUPIVACAINE HCL (PF) 0.25 % IJ SOLN
INTRAMUSCULAR | Status: DC | PRN
  Administered 2016-07-04: 10 mL

## 2016-07-04 MED ORDER — GABAPENTIN 300 MG PO CAPS
ORAL_CAPSULE | ORAL | Status: AC
  Filled 2016-07-04: qty 1

## 2016-07-04 MED ORDER — MIDAZOLAM HCL 2 MG/2ML IJ SOLN
INTRAMUSCULAR | Status: AC
  Filled 2016-07-04: qty 2

## 2016-07-04 MED ORDER — ACETAMINOPHEN 500 MG PO TABS
ORAL_TABLET | ORAL | Status: AC
  Filled 2016-07-04: qty 2

## 2016-07-04 MED ORDER — LACTATED RINGERS IV SOLN
INTRAVENOUS | Status: DC
  Administered 2016-07-04 (×2): via INTRAVENOUS

## 2016-07-04 MED ORDER — SCOPOLAMINE 1 MG/3DAYS TD PT72: 1.0000 | MEDICATED_PATCH | Freq: Once | TRANSDERMAL | Status: DC | PRN

## 2016-07-04 SURGICAL SUPPLY — 55 items
BINDER BREAST LRG (GAUZE/BANDAGES/DRESSINGS) IMPLANT
BINDER BREAST MEDIUM (GAUZE/BANDAGES/DRESSINGS) IMPLANT
BINDER BREAST XLRG (GAUZE/BANDAGES/DRESSINGS) IMPLANT
BINDER BREAST XXLRG (GAUZE/BANDAGES/DRESSINGS) IMPLANT
BLADE SURG 15 STRL LF DISP TIS (BLADE) ×1 IMPLANT
BLADE SURG 15 STRL SS (BLADE) ×2
CANISTER SUC SOCK COL 7IN (MISCELLANEOUS) IMPLANT
CANISTER SUCT 1200ML W/VALVE (MISCELLANEOUS) IMPLANT
CHLORAPREP W/TINT 26ML (MISCELLANEOUS) ×3 IMPLANT
CLIP TI WIDE RED SMALL 6 (CLIP) IMPLANT
CLOSURE WOUND 1/2 X4 (GAUZE/BANDAGES/DRESSINGS) ×1
COVER BACK TABLE 60X90IN (DRAPES) ×3 IMPLANT
COVER MAYO STAND STRL (DRAPES) ×3 IMPLANT
COVER PROBE W GEL 5X96 (DRAPES) ×3 IMPLANT
DECANTER SPIKE VIAL GLASS SM (MISCELLANEOUS) IMPLANT
DERMABOND ADVANCED (GAUZE/BANDAGES/DRESSINGS) ×2
DERMABOND ADVANCED .7 DNX12 (GAUZE/BANDAGES/DRESSINGS) ×1 IMPLANT
DEVICE DUBIN W/COMP PLATE 8390 (MISCELLANEOUS) ×3 IMPLANT
DRAPE LAPAROSCOPIC ABDOMINAL (DRAPES) ×3 IMPLANT
DRAPE UTILITY XL STRL (DRAPES) ×3 IMPLANT
DRSG TEGADERM 4X4.75 (GAUZE/BANDAGES/DRESSINGS) IMPLANT
ELECT COATED BLADE 2.86 ST (ELECTRODE) ×3 IMPLANT
ELECT REM PT RETURN 9FT ADLT (ELECTROSURGICAL) ×3
ELECTRODE REM PT RTRN 9FT ADLT (ELECTROSURGICAL) ×1 IMPLANT
GLOVE BIO SURGEON STRL SZ7 (GLOVE) ×6 IMPLANT
GLOVE BIOGEL PI IND STRL 7.5 (GLOVE) ×1 IMPLANT
GLOVE BIOGEL PI INDICATOR 7.5 (GLOVE) ×2
GOWN STRL REUS W/ TWL LRG LVL3 (GOWN DISPOSABLE) ×2 IMPLANT
GOWN STRL REUS W/TWL LRG LVL3 (GOWN DISPOSABLE) ×4
HEMOSTAT ARISTA ABSORB 3G PWDR (MISCELLANEOUS) ×3 IMPLANT
ILLUMINATOR WAVEGUIDE N/F (MISCELLANEOUS) ×3 IMPLANT
KIT MARKER MARGIN INK (KITS) ×3 IMPLANT
LIGHT WAVEGUIDE WIDE FLAT (MISCELLANEOUS) IMPLANT
NEEDLE HYPO 25X1 1.5 SAFETY (NEEDLE) ×3 IMPLANT
NS IRRIG 1000ML POUR BTL (IV SOLUTION) IMPLANT
PACK BASIN DAY SURGERY FS (CUSTOM PROCEDURE TRAY) ×3 IMPLANT
PENCIL BUTTON HOLSTER BLD 10FT (ELECTRODE) ×3 IMPLANT
SLEEVE SCD COMPRESS KNEE MED (MISCELLANEOUS) ×3 IMPLANT
SPONGE GAUZE 4X4 12PLY STER LF (GAUZE/BANDAGES/DRESSINGS) IMPLANT
SPONGE LAP 4X18 X RAY DECT (DISPOSABLE) ×3 IMPLANT
STRIP CLOSURE SKIN 1/2X4 (GAUZE/BANDAGES/DRESSINGS) ×2 IMPLANT
SUT MNCRL AB 4-0 PS2 18 (SUTURE) IMPLANT
SUT MON AB 5-0 PS2 18 (SUTURE) IMPLANT
SUT SILK 2 0 SH (SUTURE) IMPLANT
SUT VIC AB 2-0 SH 27 (SUTURE) ×2
SUT VIC AB 2-0 SH 27XBRD (SUTURE) ×1 IMPLANT
SUT VIC AB 3-0 SH 27 (SUTURE) ×2
SUT VIC AB 3-0 SH 27X BRD (SUTURE) ×1 IMPLANT
SUT VIC AB 5-0 PS2 18 (SUTURE) IMPLANT
SYR CONTROL 10ML LL (SYRINGE) ×3 IMPLANT
TOWEL OR 17X24 6PK STRL BLUE (TOWEL DISPOSABLE) ×3 IMPLANT
TOWEL OR NON WOVEN STRL DISP B (DISPOSABLE) ×3 IMPLANT
TUBE CONNECTING 20'X1/4 (TUBING)
TUBE CONNECTING 20X1/4 (TUBING) IMPLANT
YANKAUER SUCT BULB TIP NO VENT (SUCTIONS) IMPLANT

## 2016-07-04 NOTE — Anesthesia Preprocedure Evaluation (Addendum)
Anesthesia Evaluation  Patient identified by MRN, date of birth, ID band Patient awake    Reviewed: Allergy & Precautions, NPO status , Patient's Chart, lab work & pertinent test results  Airway Mallampati: II  TM Distance: >3 FB Neck ROM: Full    Dental no notable dental hx.    Pulmonary neg pulmonary ROS,    Pulmonary exam normal breath sounds clear to auscultation       Cardiovascular negative cardio ROS Normal cardiovascular exam Rhythm:Regular Rate:Normal     Neuro/Psych negative neurological ROS  negative psych ROS   GI/Hepatic negative GI ROS, Neg liver ROS,   Endo/Other  negative endocrine ROS  Renal/GU negative Renal ROS  negative genitourinary   Musculoskeletal negative musculoskeletal ROS (+)   Abdominal   Peds negative pediatric ROS (+)  Hematology negative hematology ROS (+)   Anesthesia Other Findings   Reproductive/Obstetrics negative OB ROS                             Anesthesia Physical Anesthesia Plan  ASA: II  Anesthesia Plan: General   Post-op Pain Management:    Induction: Intravenous  Airway Management Planned: LMA  Additional Equipment:   Intra-op Plan:   Post-operative Plan: Extubation in OR  Informed Consent: I have reviewed the patients History and Physical, chart, labs and discussed the procedure including the risks, benefits and alternatives for the proposed anesthesia with the patient or authorized representative who has indicated his/her understanding and acceptance.   Dental advisory given  Plan Discussed with: CRNA  Anesthesia Plan Comments:         Anesthesia Quick Evaluation  

## 2016-07-04 NOTE — Anesthesia Postprocedure Evaluation (Signed)
Anesthesia Post Note  Patient: Carrie Lang  Procedure(s) Performed: Procedure(s) (LRB): RADIOACTIVE SEED X'S 2 GUIDED EXCISIONAL LEFT  BREAST BIOPSY (Left)  Patient location during evaluation: PACU Anesthesia Type: General Level of consciousness: awake and alert Pain management: pain level controlled Vital Signs Assessment: post-procedure vital signs reviewed and stable Respiratory status: spontaneous breathing, nonlabored ventilation, respiratory function stable and patient connected to nasal cannula oxygen Cardiovascular status: blood pressure returned to baseline and stable Postop Assessment: no signs of nausea or vomiting Anesthetic complications: no    Last Vitals:  Vitals:   07/04/16 0921 07/04/16 0935  BP:  120/66  Pulse: 74 78  Resp: (!) 9 16  Temp:  36.7 C    Last Pain:  Vitals:   07/04/16 0935  TempSrc:   PainSc: 3                  [name]

## 2016-07-04 NOTE — Op Note (Signed)
Preoperative diagnoses:Left breast mammographic lesion times two Postoperative diagnosis: Same as above Procedure:Leftbreast seed guided excisional biopsy times two Surgeon: Dr. Serita Grammes Anesthesia: Gen. Estimated blood loss: Minimal Complications: None Drains: None Specimens:anterior left breast tissue marked with paint, posterior left breast tissue marked with paint Sponge and needle count correct at completion Disposition to recovery stable  Indications: This is a 42 yof who has two mammographic findings that are benign on core. One is papillary lesion and the other is a csl.  We have discussed options and have decided to proceed with excision of both of these with seed guidance.  She had two seeds placed prior to beginning. I had these mm in the or.   Procedure: After informed consent was obtained she was then taken to the operating room. She was given cefazolin. Sequential compression devices were on her legs. She was placed under general anesthesia without complication. Her leftbreast was then prepped and draped in the standard sterile surgical fashion. A surgical timeout was then performed.  I located the radioactive seeds with the neoprobe. I infiltrated marcaine in the area of the seed and periareolar.  I made a periareolar incision. I used the lighted retractors to tunnel to the first lesion anteriorly.I then used the neoprobe to guide the excision of the seed and surrounding tissue. This was confirmed by the neoprobe. This was then taken for mammogram which confirmed removal of the seed and the clip. This was confirmed by radiology. This was then sent to pathology. I then identified the deeper lesion and excised this. The seed was visible in the tissue so to ensure control I removed from the tissue.  Mammogram showed the clip in the specimen.  Hemostasis was observed.I closed the breast tissue with a 2-0 Vicryl. The dermis was closed with 3-0 Vicryl and the skin with 5-0  Monocryl.Dermabond and steristrips were placed on the incision. She was transferred to recovery stable

## 2016-07-04 NOTE — H&P (Signed)
ABRIELLE GOLDAMMER is an 50 y.o. female.   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 She had undergone undergone intensive hormonal therapy with embryo transfer and was pregnant with twins at last visit. She has since been home to [place], delivered twins who are doing well and returned. She underwent a mm with finding of a 7x9 mm hypoechoic area in the left breast. She had no complaints referable to either breast prior to this or now. No discharge, no mass palpated on her own exam. 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 she has prior history of benign FA excision when she was young. No family history of breast or ovarian cancer. She also had a distortion that was seen on 3d but did not persist. She now returns with prior lesion and then underwent repeat mm with distortion still present. This is 3.5 cm anterior to the other clip. right side normal. density is c. she has now undergone biopsy of the distortion and this is csl.    Past Medical History:  Diagnosis Date  . GERD (gastroesophageal reflux disease)   . Gluten free diet   . Hyperglycemia   . Hyperlipidemia   . Intraductal papilloma of breast, left   . Migraine   . Rhinitis   . Seasonal allergies   . SVT (supraventricular tachycardia) ([place])   . Ulcer [facility])     Past Surgical History:  Procedure Laterality Date  . BREAST SURGERY     right    nodule removed - benign  . DILATION AND CURETTAGE OF UTERUS     TAB x 2  . HYSTEROSCOPY W/D&C N/A 10/02/2012   Procedure: DILATATION AND CURETTAGE /HYSTEROSCOPY;  Surgeon: Lahoma Crocker, MD;  Location: [facility];  Service: Gynecology;  Laterality: N/A;  POLYPECTOMY/ POSSIBLE TRUCLER  . rectal polyps     removed    Family History  Problem Relation Age of Onset  . Hypertension Mother   . Asthma Mother   . Hypertension Father   . Hyperlipidemia Father   . Heart  disease Maternal Grandmother   . Hyperlipidemia Maternal Grandmother   . Heart disease Maternal Grandfather   . Hyperlipidemia Maternal Grandfather   . Heart disease Paternal Grandmother   . Hyperlipidemia Paternal Grandmother   . Heart disease Paternal Grandfather   . Hyperlipidemia Paternal Grandfather    Social History:  reports that she has never smoked. She has never used smokeless tobacco. She reports that she does not drink alcohol or use drugs.  Allergies:  Allergies  Allergen Reactions  . Dexilant [Dexlansoprazole] Other (See Comments)    Fever & lymphadenopathy  . Gluten Meal   . Nsaids Nausea Only    Hx of ulcers    Medications Prior to Admission  Medication Sig Dispense Refill  . baclofen (LIORESAL) 20 MG tablet Take 20 mg by mouth 3 (three) times daily.    . cetirizine (ZYRTEC) 10 MG tablet Take 10 mg by mouth daily as needed. For allergies    . escitalopram (LEXAPRO) 10 MG tablet Take 10 mg by mouth daily.    . fluticasone (FLONASE) 50 MCG/ACT nasal spray Place into both nostrils daily.    . RABEprazole (ACIPHEX) 20 MG tablet Take 20 mg by mouth daily.    . SUMAtriptan (IMITREX) 50 MG tablet Take 50 mg by mouth every 2 (two) hours as needed for migraine. May repeat in  2 hours if headache persists or recurs.    Marland Kitchen atorvastatin (LIPITOR) 20 MG tablet Take 20 mg by mouth daily.      No results found for this or any previous visit (from the past 48 hour(s)). No results found.  ROS Negative  Blood pressure 127/70, pulse 88, temperature 98.2 F (36.8 C), temperature source Oral, resp. rate 20, height 5\' 6"  (1.676 m), weight 68.9 kg (152 lb), last menstrual period 04/28/2016, SpO2 98 %, unknown if currently breastfeeding. Physical Exam   Vitals (Robin Gwynn RMA; 06/03/2016 12:31 PM) 06/03/2016 12:30 PM Weight: 151 lb Height: 66in Body Surface Area: 1.77 m Body Mass Index: 24.37 kg/m  Pulse: 76 (Regular)  BP: 136/88 (Sitting, Left Arm,  Standard)  Physical Exam Rolm Bookbinder MD; 06/03/2016 1:00 PM) General Mental Status-Alert. Orientation-Oriented X3.  Chest and Lung Exam Chest and lung exam reveals -on auscultation, normal breath sounds, no adventitious sounds and normal vocal resonance.  Breast Nipples-No Discharge. Breast Lump-No Palpable Breast Mass.  Cardiovascular Cardiovascular examination reveals -normal heart sounds, regular rate and rhythm with no murmurs.  Lymphatic Head & Neck  General Head & Neck Lymphatics: Bilateral - Description - Normal. Axillary  General Axillary Region: Bilateral - Description - Normal. Note: no Lasker adenopathy   Assessment/Plan PAPILLOMA OF BREAST (D24.9) RADIAL SCAR OF BREAST (N64.89)  Plan for seed guided excision x 2 of these left breast lesions   Payal Stanforth, MD 07/04/2016, 7:10 AM

## 2016-07-04 NOTE — Interval H&P Note (Signed)
History and Physical Interval Note:  07/04/2016 7:12 AM  Carrie Lang  has presented today for surgery, with the diagnosis of LEFT BREAST PAPILLOMA  The various methods of treatment have been discussed with the patient and family. After consideration of risks, benefits and other options for treatment, the patient has consented to  Procedure(s): RADIOACTIVE SEED X'S 2 GUIDED EXCISIONAL LEFT  BREAST BIOPSY (Left) as a surgical intervention .  The patient's history has been reviewed, patient examined, no change in status, stable for surgery.  I have reviewed the patient's chart and labs.  Questions were answered to the patient's satisfaction.     Jashawn Floyd

## 2016-07-04 NOTE — Anesthesia Preprocedure Evaluation (Addendum)
Anesthesia Evaluation  Patient identified by MRN, date of birth, ID band Patient awake    Reviewed: Allergy & Precautions, NPO status , Patient's Chart, lab work & pertinent test results  Airway Mallampati: II  TM Distance: >3 FB Neck ROM: Full    Dental no notable dental hx.    Pulmonary neg pulmonary ROS,    Pulmonary exam normal breath sounds clear to auscultation       Cardiovascular negative cardio ROS Normal cardiovascular exam+ dysrhythmias Supra Ventricular Tachycardia  Rhythm:Regular Rate:Normal     Neuro/Psych negative neurological ROS  negative psych ROS   GI/Hepatic Neg liver ROS, GERD  Medicated and Controlled,  Endo/Other  negative endocrine ROS  Renal/GU negative Renal ROS  negative genitourinary   Musculoskeletal negative musculoskeletal ROS (+)   Abdominal   Peds negative pediatric ROS (+)  Hematology negative hematology ROS (+)   Anesthesia Other Findings   Reproductive/Obstetrics negative OB ROS                            Anesthesia Physical Anesthesia Plan  ASA: II  Anesthesia Plan: General   Post-op Pain Management:    Induction: Intravenous  Airway Management Planned: LMA  Additional Equipment:   Intra-op Plan:   Post-operative Plan: Extubation in OR  Informed Consent: I have reviewed the patients History and Physical, chart, labs and discussed the procedure including the risks, benefits and alternatives for the proposed anesthesia with the patient or authorized representative who has indicated his/her understanding and acceptance.   Dental advisory given  Plan Discussed with: CRNA  Anesthesia Plan Comments:         Anesthesia Quick Evaluation

## 2016-07-04 NOTE — Anesthesia Procedure Notes (Signed)
Procedure Name: LMA Insertion Date/Time: 07/04/2016 7:35 AM Performed by: Baxter Flattery Pre-anesthesia Checklist: Patient identified, Emergency Drugs available, Suction available and Patient being monitored Patient Re-evaluated:Patient Re-evaluated prior to inductionOxygen Delivery Method: Circle system utilized Preoxygenation: Pre-oxygenation with 100% oxygen Intubation Type: IV induction Ventilation: Mask ventilation without difficulty LMA: LMA inserted LMA Size: 4.0 Number of attempts: 1 Airway Equipment and Method: Bite block Placement Confirmation: positive ETCO2 and breath sounds checked- equal and bilateral Tube secured with: Tape Dental Injury: Teeth and Oropharynx as per pre-operative assessment

## 2016-07-04 NOTE — Discharge Instructions (Signed)
[facility] °Office Phone Number [phone] ° °POST OP INSTRUCTIONS ° °Always review your discharge instruction sheet given to you by the facility where your surgery was performed. ° °IF YOU HAVE DISABILITY OR FAMILY LEAVE FORMS, YOU MUST BRING THEM TO THE OFFICE FOR PROCESSING.  DO NOT GIVE THEM TO YOUR DOCTOR. ° °1. A prescription for pain medication may be given to you upon discharge.  Take your pain medication as prescribed, if needed.  If narcotic pain medicine is not needed, then you may take acetaminophen (Tylenol), naprosyn (Alleve) or ibuprofen (Advil) as needed. °2. Take your usually prescribed medications unless otherwise directed °3. If you need a refill on your pain medication, please contact your pharmacy.  They will contact our office to request authorization.  Prescriptions will not be filled after 5pm or on week-ends. °4. You should eat very light the first 24 hours after surgery, such as soup, crackers, pudding, etc.  Resume your normal diet the day after surgery. °5. Most patients will experience some swelling and bruising in the breast.  Ice packs and a good support bra will help.  Wear the breast binder provided or a sports bra for 72 hours day and night.  After that wear a sports bra during the day until you return to the office. Swelling and bruising can take several days to resolve.  °6. It is common to experience some constipation if taking pain medication after surgery.  Increasing fluid intake and taking a stool softener will usually help or prevent this problem from occurring.  A mild laxative (Milk of Magnesia or Miralax) should be taken according to package directions if there are no bowel movements after 48 hours. °7. Unless discharge instructions indicate otherwise, you may remove your bandages 48 hours after surgery and you may shower at that time.  You may have steri-strips (small skin tapes) in place directly over the incision.  These strips should be left on the  skin for 7-10 days and will come off on their own.  If your surgeon used skin glue on the incision, you may shower in 24 hours.  The glue will flake off over the next 2-3 weeks.  Any sutures or staples will be removed at the office during your follow-up visit. °8. ACTIVITIES:  You may resume regular daily activities (gradually increasing) beginning the next day.  Wearing a good support bra or sports bra minimizes pain and swelling.  You may have sexual intercourse when it is comfortable. °a. You may drive when you no longer are taking prescription pain medication, you can comfortably wear a seatbelt, and you can safely maneuver your car and apply brakes. °b. RETURN TO WORK:  ______________________________________________________________________________________ °9. You should see your doctor in the office for a follow-up appointment approximately two weeks after your surgery.  Your doctor’s nurse will typically make your follow-up appointment when she calls you with your pathology report.  Expect your pathology report 3-4 business days after your surgery.  You may call to check if you do not hear from us after three days. °10. OTHER INSTRUCTIONS: _______________________________________________________________________________________________ _____________________________________________________________________________________________________________________________________ °_____________________________________________________________________________________________________________________________________ °_____________________________________________________________________________________________________________________________________ ° °WHEN TO CALL DR WAKEFIELD: °1. Fever over 101.0 °2. Nausea and/or vomiting. °3. Extreme swelling or bruising. °4. Continued bleeding from incision. °5. Increased pain, redness, or drainage from the incision. ° °The clinic staff is available to answer your questions during regular  business hours.  Please don’t hesitate to call and ask to speak to one of the nurses for clinical concerns.  If   you have a medical emergency, go to the nearest emergency room or call 911.  A surgeon from [facility] is always on call at the hospital. ° °For further questions, please visit [email or web address] ° ° ° °Post Anesthesia Home Care Instructions ° °Activity: °Get plenty of rest for the remainder of the day. A responsible adult should stay with you for 24 hours following the procedure.  °For the next 24 hours, DO NOT: °-Drive a car °-Operate machinery °-Drink alcoholic beverages °-Take any medication unless instructed by your physician °-Make any legal decisions or sign important papers. ° °Meals: °Start with liquid foods such as gelatin or soup. Progress to regular foods as tolerated. Avoid greasy, spicy, heavy foods. If nausea and/or vomiting occur, drink only clear liquids until the nausea and/or vomiting subsides. Call your physician if vomiting continues. ° °Special Instructions/Symptoms: °Your throat may feel dry or sore from the anesthesia or the breathing tube placed in your throat during surgery. If this causes discomfort, gargle with warm salt water. The discomfort should disappear within 24 hours. ° °If you had a scopolamine patch placed behind your ear for the management of post- operative nausea and/or vomiting: ° °1. The medication in the patch is effective for 72 hours, after which it should be removed.  Wrap patch in a tissue and discard in the trash. Wash hands thoroughly with soap and water. °2. You may remove the patch earlier than 72 hours if you experience unpleasant side effects which may include dry mouth, dizziness or visual disturbances. °3. Avoid touching the patch. Wash your hands with soap and water after contact with the patch. °  ° °

## 2016-07-04 NOTE — Transfer of Care (Signed)
Immediate Anesthesia Transfer of Care Note  Patient: Carrie Lang  Procedure(s) Performed: Procedure(s): RADIOACTIVE SEED X'S 2 GUIDED EXCISIONAL LEFT  BREAST BIOPSY (Left)  Patient Location: PACU  Anesthesia Type:General  Level of Consciousness: awake, sedated and responds to stimulation  Airway & Oxygen Therapy: Patient Spontanous Breathing and Patient connected to face mask oxygen  Post-op Assessment: Report given to RN, Post -op Vital signs reviewed and stable and Patient moving all extremities  Post vital signs: Reviewed and stable  Last Vitals:  Vitals:   07/04/16 0658 07/04/16 0827  BP: 127/70   Pulse: 88 70  Resp: 20   Temp: 36.8 C     Last Pain:  Vitals:   07/04/16 0658  TempSrc: Oral         Complications: No apparent anesthesia complications

## 2016-07-05 ENCOUNTER — Encounter (HOSPITAL_BASED_OUTPATIENT_CLINIC_OR_DEPARTMENT_OTHER): Payer: Self-pay | Admitting: General Surgery

## 2016-07-23 DIAGNOSIS — H93A2 Pulsatile tinnitus, left ear: Secondary | ICD-10-CM | POA: Insufficient documentation

## 2016-07-30 DIAGNOSIS — Z9889 Other specified postprocedural states: Secondary | ICD-10-CM | POA: Insufficient documentation

## 2016-09-24 ENCOUNTER — Encounter: Payer: Self-pay | Admitting: Vascular Surgery

## 2016-09-30 DIAGNOSIS — G8929 Other chronic pain: Secondary | ICD-10-CM | POA: Insufficient documentation

## 2016-10-01 ENCOUNTER — Ambulatory Visit (INDEPENDENT_AMBULATORY_CARE_PROVIDER_SITE_OTHER): Payer: Managed Care, Other (non HMO) | Admitting: Vascular Surgery

## 2016-10-01 ENCOUNTER — Encounter (HOSPITAL_COMMUNITY): Payer: Managed Care, Other (non HMO)

## 2016-10-01 ENCOUNTER — Encounter: Payer: Self-pay | Admitting: Vascular Surgery

## 2016-10-01 VITALS — BP 114/76 | HR 67 | Temp 97.9°F | Resp 16 | Ht 66.0 in | Wt 153.0 lb

## 2016-10-01 DIAGNOSIS — I773 Arterial fibromuscular dysplasia: Secondary | ICD-10-CM

## 2016-10-01 NOTE — Progress Notes (Signed)
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 female, who is in today for discussion of recent MRA. She was having pulsatile tinnitus and saw Dr. Redmond Baseman for ENT evaluation. Evaluation included MRA which suggested mild fibromuscular dysplasia. No evidence of involvement of her right carotid or vertebral arteries. She had a disc brought with her from outlying imaging center. Unfortunately this disc was blank. We have requested repeat disc that be sent so I can review the images. I do have the official report. The patient denies any focal neurologic deficits. Specifically no evidence of amaurosis fugax, aphasia or focal deficit. She is right-handed. She did have in vitro fertilization and twins born approximately 2 years ago.  Past Medical History:  Diagnosis Date  . GERD (gastroesophageal reflux disease)   . Gluten free diet   . Hyperglycemia   . Hyperlipidemia   . Intraductal papilloma of breast, left   . Migraine   . Rhinitis   . Seasonal allergies   . SVT (supraventricular tachycardia) ([place])   . Ulcer ([place])     Family History  Problem Relation Age of Onset  . Hypertension Mother   . Asthma Mother   . Hypertension Father   . Hyperlipidemia Father   . Heart disease Maternal Grandmother   . Hyperlipidemia Maternal Grandmother   . Heart disease Maternal Grandfather   . Hyperlipidemia Maternal Grandfather   . Heart disease Paternal Grandmother   . Hyperlipidemia Paternal Grandmother   . Heart disease Paternal Grandfather   . Hyperlipidemia Paternal Grandfather     SOCIAL HISTORY: Social History   Social History  . Marital status: Married    Spouse name: N/A  . Number of children: N/A  . Years of education: N/A   Occupational  History  . Not on file.   Social History Main Topics  . Smoking status: Never Smoker  . Smokeless tobacco: Never Used  . Alcohol use No     Comment: occasionally   . Drug use: No  . Sexual activity: Yes    Birth control/ protection: None   Other Topics Concern  . Not on file   Social History Narrative  . No narrative on file    Allergies  Allergen Reactions  . Dexilant [Dexlansoprazole] Other (See Comments)    Fever & lymphadenopathy  . Gluten Meal   . Nsaids Nausea Only    Hx of ulcers    Current Outpatient Prescriptions  Medication Sig Dispense Refill  . acetaminophen (TYLENOL) 325 MG tablet Take 650 mg by mouth every 6 (six) hours as needed.    Marland Kitchen atorvastatin (LIPITOR) 20 MG tablet Take 20 mg by mouth daily.    . baclofen (LIORESAL) 20 MG tablet Take 20 mg by mouth 3 (three) times daily.    . cetirizine (ZYRTEC) 10 MG tablet Take 10 mg by mouth daily as needed. For allergies    . escitalopram (LEXAPRO) 10 MG tablet Take 20 mg by mouth daily.     Marland Kitchen esomeprazole (NEXIUM) 20 MG capsule Take 20 mg by mouth daily at 12 noon.    . fluticasone (FLONASE) 50 MCG/ACT nasal spray Place into both nostrils daily.    . SUMAtriptan (IMITREX) 50 MG tablet Take 50 mg by mouth every 2 (two) hours as needed for migraine. May repeat in 2  hours if headache persists or recurs.     No current facility-administered medications for this visit.     REVIEW OF SYSTEMS:  [X]  denotes positive finding, [ ]  denotes negative finding Cardiac  Comments:  Chest pain or chest pressure:    Shortness of breath upon exertion:    Short of breath when lying flat:    Irregular heart rhythm:        Vascular    Pain in calf, thigh, or hip brought on by ambulation:    Pain in feet at night that wakes you up from your sleep:     Blood clot in your veins:    Leg swelling:         Pulmonary    Oxygen at home:    Productive cough:     Wheezing:         Neurologic    Sudden weakness in arms or legs:      Sudden numbness in arms or legs:     Sudden onset of difficulty speaking or slurred speech:    Temporary loss of vision in one eye:     Problems with dizziness:         Gastrointestinal    Blood in stool:     Vomited blood:         Genitourinary    Burning when urinating:  x   Blood in urine:        Psychiatric    Major depression:         Hematologic    Bleeding problems:    Problems with blood clotting too easily:        Skin    Rashes or ulcers:        Constitutional    Fever or chills:      PHYSICAL EXAM: Vitals:   10/01/16 1246  BP: 114/76  Pulse: 67  Resp: 16  Temp: 97.9 F (36.6 C)  TempSrc: Oral  SpO2: 96%  Weight: 153 lb (69.4 kg)  Height: 5\' 6"  (1.676 m)    GENERAL: The patient is a well-nourished female, in no acute distress. The vital signs are documented above. CARDIOVASCULAR: Carotid arteries without bruits bilaterally. 2+ radial and 2+ dorsalis pedis pulses bilaterally PULMONARY: There is good air exchange  ABDOMEN: Soft and non-tender  MUSCULOSKELETAL: There are no major deformities or cyanosis. NEUROLOGIC: No focal weakness or paresthesias are detected. SKIN: There are no ulcers or rashes noted. PSYCHIATRIC: The patient has a normal affect.  DATA:  I do have her MRI of her brain which is normal. 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 Will obtain actual images to review  MEDICAL ISSUES: Had long discussion with patient. She is asymptomatic. I will review her actual films but assuming that this is correct this is mild changes. Explained that no real known cause for this and would not recommend any other imaging studies currently. Would check a duplex in approximately 2 years to rule out any change in her degree of stenosis. Currently there is no stenosis. Also discussed symptoms of left brain event's she will notify us should this occur.   Rosetta Posner, MD FACS [facility] Tel [phone] Pager [phone]

## 2016-10-03 ENCOUNTER — Encounter: Payer: Self-pay | Admitting: Neuroradiology

## 2017-01-08 DIAGNOSIS — E559 Vitamin D deficiency, unspecified: Secondary | ICD-10-CM | POA: Insufficient documentation

## 2017-07-14 DIAGNOSIS — R7301 Impaired fasting glucose: Secondary | ICD-10-CM | POA: Insufficient documentation

## 2017-07-14 DIAGNOSIS — G8929 Other chronic pain: Secondary | ICD-10-CM | POA: Insufficient documentation

## 2017-07-14 DIAGNOSIS — K3184 Gastroparesis: Secondary | ICD-10-CM | POA: Insufficient documentation

## 2017-07-14 DIAGNOSIS — K900 Celiac disease: Secondary | ICD-10-CM | POA: Insufficient documentation

## 2017-07-14 DIAGNOSIS — I773 Arterial fibromuscular dysplasia: Secondary | ICD-10-CM | POA: Insufficient documentation

## 2017-07-14 DIAGNOSIS — R0683 Snoring: Secondary | ICD-10-CM | POA: Insufficient documentation

## 2017-07-16 DIAGNOSIS — M7662 Achilles tendinitis, left leg: Secondary | ICD-10-CM | POA: Insufficient documentation

## 2017-07-16 DIAGNOSIS — M79672 Pain in left foot: Secondary | ICD-10-CM | POA: Insufficient documentation

## 2017-09-01 DIAGNOSIS — N951 Menopausal and female climacteric states: Secondary | ICD-10-CM | POA: Insufficient documentation

## 2017-09-01 DIAGNOSIS — N393 Stress incontinence (female) (male): Secondary | ICD-10-CM | POA: Insufficient documentation

## 2017-09-01 DIAGNOSIS — N952 Postmenopausal atrophic vaginitis: Secondary | ICD-10-CM | POA: Insufficient documentation

## 2017-09-01 DIAGNOSIS — N941 Unspecified dyspareunia: Secondary | ICD-10-CM | POA: Insufficient documentation

## 2018-10-06 ENCOUNTER — Ambulatory Visit: Payer: Managed Care, Other (non HMO) | Admitting: Vascular Surgery

## 2018-10-06 ENCOUNTER — Encounter (HOSPITAL_COMMUNITY): Payer: Managed Care, Other (non HMO)

## 2018-11-10 ENCOUNTER — Ambulatory Visit: Payer: Managed Care, Other (non HMO) | Admitting: Vascular Surgery

## 2019-07-27 DIAGNOSIS — I6523 Occlusion and stenosis of bilateral carotid arteries: Secondary | ICD-10-CM | POA: Insufficient documentation

## 2019-09-28 DIAGNOSIS — K760 Fatty (change of) liver, not elsewhere classified: Secondary | ICD-10-CM | POA: Insufficient documentation

## 2019-11-27 ENCOUNTER — Ambulatory Visit: Payer: Self-pay | Attending: Internal Medicine

## 2019-11-27 DIAGNOSIS — Z23 Encounter for immunization: Secondary | ICD-10-CM

## 2019-11-27 NOTE — Progress Notes (Signed)
   Covid-19 Vaccination Clinic  Name:  Carrie Lang    MRN: [id number] DOB: [birth year]  11/27/2019  [name] was observed post Covid-19 immunization for 15 minutes without incident. She was provided with Vaccine Information Sheet and instruction to access the V-Safe system.   [name] was instructed to call 911 with any severe reactions post vaccine: Marland Kitchen Difficulty breathing  . Swelling of face and throat  . A fast heartbeat  . A bad rash all over body  . Dizziness and weakness   Immunizations Administered    Name Date Dose VIS Date Route   Pfizer COVID-19 Vaccine 11/27/2019  3:21 PM 0.3 mL 07/30/2019 Intramuscular   Manufacturer: [employer]   Lot: [id number]   [place]: [id number]

## 2019-12-18 ENCOUNTER — Ambulatory Visit: Payer: Self-pay

## 2019-12-18 ENCOUNTER — Ambulatory Visit: Payer: Self-pay | Attending: Internal Medicine

## 2019-12-18 DIAGNOSIS — Z23 Encounter for immunization: Secondary | ICD-10-CM

## 2019-12-18 NOTE — Progress Notes (Signed)
   Covid-19 Vaccination Clinic  Name:  UTHA FREDE    MRN: [id number] DOB: 10/10/1965  12/18/2019  Ms. Shean was observed post Covid-19 immunization for 15 minutes without incident. She was provided with Vaccine Information Sheet and instruction to access the V-Safe system.   Ms. Altman was instructed to call 911 with any severe reactions post vaccine: Marland Kitchen Difficulty breathing  . Swelling of face and throat  . A fast heartbeat  . A bad rash all over body  . Dizziness and weakness   Immunizations Administered    Name Date Dose VIS Date Route   Pfizer COVID-19 Vaccine 12/18/2019 11:08 AM 0.3 mL 10/13/2018 Intramuscular   Manufacturer: [place]   Lot: [id number]   [place]: [id number]      Covid-19 Vaccination Clinic  Name:  TARINA SCALISE    MRN: [id number] DOB: Jan 14, 1966  12/18/2019  Ms. Sawdon was observed post Covid-19 immunization for 15 minutes without incident. She was provided with Vaccine Information Sheet and instruction to access the V-Safe system.   Ms. Veloso was instructed to call 911 with any severe reactions post vaccine: Marland Kitchen Difficulty breathing  . Swelling of face and throat  . A fast heartbeat  . A bad rash all over body  . Dizziness and weakness   Immunizations Administered    Name Date Dose VIS Date Route   Pfizer COVID-19 Vaccine 12/18/2019 11:08 AM 0.3 mL 10/13/2018 Intramuscular   Manufacturer: [place]   Lot: [id number]   [place]: [id number]

## 2019-12-22 ENCOUNTER — Ambulatory Visit: Payer: Self-pay

## 2020-03-10 DIAGNOSIS — Z801 Family history of malignant neoplasm of trachea, bronchus and lung: Secondary | ICD-10-CM | POA: Insufficient documentation

## 2020-03-10 DIAGNOSIS — R5383 Other fatigue: Secondary | ICD-10-CM | POA: Insufficient documentation

## 2020-03-10 DIAGNOSIS — R590 Localized enlarged lymph nodes: Secondary | ICD-10-CM | POA: Insufficient documentation

## 2021-02-22 DIAGNOSIS — L658 Other specified nonscarring hair loss: Secondary | ICD-10-CM | POA: Insufficient documentation

## 2021-02-22 DIAGNOSIS — L219 Seborrheic dermatitis, unspecified: Secondary | ICD-10-CM | POA: Insufficient documentation

## 2021-03-02 ENCOUNTER — Other Ambulatory Visit: Payer: Self-pay

## 2021-03-02 DIAGNOSIS — I6523 Occlusion and stenosis of bilateral carotid arteries: Secondary | ICD-10-CM

## 2021-03-09 ENCOUNTER — Other Ambulatory Visit: Payer: Self-pay

## 2021-03-09 ENCOUNTER — Encounter: Payer: Self-pay | Admitting: Vascular Surgery

## 2021-03-09 ENCOUNTER — Ambulatory Visit (INDEPENDENT_AMBULATORY_CARE_PROVIDER_SITE_OTHER): Payer: 59 | Admitting: Vascular Surgery

## 2021-03-09 ENCOUNTER — Ambulatory Visit (HOSPITAL_COMMUNITY)
Admission: RE | Admit: 2021-03-09 | Discharge: 2021-03-09 | Disposition: A | Discharge status: Home / Self Care | Payer: 59 | Source: Ambulatory Visit | Attending: Vascular Surgery | Admitting: Vascular Surgery

## 2021-03-09 VITALS — BP 125/79 | HR 79 | Resp 20 | Ht 66.0 in | Wt 165.0 lb

## 2021-03-09 DIAGNOSIS — I6523 Occlusion and stenosis of bilateral carotid arteries: Secondary | ICD-10-CM | POA: Diagnosis present

## 2021-03-09 NOTE — Patient Instructions (Signed)
See above

## 2021-03-09 NOTE — Progress Notes (Signed)
Patient ID: Carrie Lang, female   DOB: Mar 21, 1966, 55 y.o.   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  She was seen 4 years ago after diagnosis of fibromuscular dysplasia during work-up for tinnitus.  She states that occasionally she can still hear her heartbeat and does have persistent tinnitus.  She denies any history of stroke, TIA or amaurosis.  She does not have any other evidence of vascular disease.  She states that at the last time of her evaluation she was having significant stress in her life with work as well as Haematologist for young twins.  Many of her stressors are relieved at this time she has no new issues just here to follow-up with carotid duplex.  She takes aspirin daily.  Past Medical History:  Diagnosis Date   GERD (gastroesophageal reflux disease)    Gluten free diet    Hyperglycemia    Hyperlipidemia    Intraductal papilloma of breast, left    Migraine    Rhinitis    Seasonal allergies    SVT (supraventricular tachycardia) (HCC)    Ulcer    Family History  Problem Relation Age of Onset   Hypertension Mother    Asthma Mother    Hypertension Father    Hyperlipidemia Father    Heart disease Maternal Grandmother    Hyperlipidemia Maternal Grandmother    Heart disease Maternal Grandfather    Hyperlipidemia Maternal Grandfather    Heart disease Paternal Grandmother    Hyperlipidemia Paternal Grandmother    Heart disease Paternal Grandfather    Hyperlipidemia Paternal Grandfather    Past Surgical History:  Procedure Laterality Date   BREAST SURGERY     right    nodule removed - benign   DILATION AND CURETTAGE OF UTERUS     TAB x 2   HYSTEROSCOPY WITH D & C N/A 10/02/2012   Procedure: DILATATION AND CURETTAGE /HYSTEROSCOPY;  Surgeon: Lahoma Crocker, MD;  Location: [facility];  Service: Gynecology;   Laterality: N/A;  POLYPECTOMY/ POSSIBLE TRUCLER   RADIOACTIVE SEED GUIDED EXCISIONAL BREAST BIOPSY Left 07/04/2016   Procedure: RADIOACTIVE SEED X'S 2 GUIDED EXCISIONAL LEFT  BREAST BIOPSY;  Surgeon: Rolm Bookbinder, MD;  Location: [facility];  Service: General;  Laterality: Left;  RADIOACTIVE SEED X'S 2 GUIDED EXCISIONAL LEFT  BREAST BIOPSY   rectal polyps     removed    Short Social History:  Social History   Tobacco Use   Smoking status: Never   Smokeless tobacco: Never  Substance Use Topics   Alcohol use: No    Comment: occasionally     Allergies  Allergen Reactions   Dexilant [Dexlansoprazole] Other (See Comments)    Fever & lymphadenopathy   Gluten Meal    Nsaids Nausea Only    Hx of ulcers   Tolmetin Nausea Only    Hx of ulcers Hx of ulcers    Current Outpatient Medications  Medication Sig Dispense Refill   acetaminophen (TYLENOL) 325 MG tablet Take 650 mg by mouth every 6 (six) hours as needed.     aspirin 81 MG EC tablet Take 1 tablet by mouth daily.     azelastine (ASTELIN) 0.1 % nasal spray Place into the nose.     bicalutamide (CASODEX) 50 MG tablet Take 1/2 tab by mouth daily  cetirizine (ZYRTEC) 10 MG tablet Take 10 mg by mouth daily as needed. For allergies     desvenlafaxine (PRISTIQ) 50 MG 24 hr tablet Take 50 mg by mouth daily.     metFORMIN (GLUCOPHAGE) 500 MG tablet Take 500 mg by mouth daily.     RABEprazole (ACIPHEX) 20 MG tablet Take by mouth.     rosuvastatin (CRESTOR) 10 MG tablet Take 10 mg by mouth at bedtime.     SUMAtriptan (IMITREX) 100 MG tablet Take by mouth.     triamcinolone cream (KENALOG) 0.1 % SMARTSIG:1 Application Topical 2-3 Times Daily     Vitamin D, Ergocalciferol, (DRISDOL) 1.25 MG (50000 UNIT) CAPS capsule Take by mouth.     No current facility-administered medications for this visit.    Review of Systems  Constitutional:  Constitutional negative. HENT: HENT negative.  Eyes: Eyes negative.   Respiratory: Respiratory negative.  Cardiovascular: Cardiovascular negative.  GI: Gastrointestinal negative.  Musculoskeletal: Musculoskeletal negative.  Skin: Skin negative.  Neurological: Neurological negative. Hematologic: Hematologic/lymphatic negative.  Psychiatric: Psychiatric negative.       Objective:  Objective   Vitals:   03/09/21 0904 03/09/21 0906  BP: 130/81 125/79  Pulse: 79   Resp: 20   SpO2: 95%   Weight: 165 lb (74.8 kg)   Height: '5\' 6"'$  (1.676 m)    Body mass index is 26.63 kg/m.  Physical Exam HENT:     Head: Normocephalic.     Nose:     Comments: Wearing a mask Eyes:     Pupils: Pupils are equal, round, and reactive to light.  Neck:     Vascular: No carotid bruit.  Cardiovascular:     Rate and Rhythm: Normal rate.     Pulses: Normal pulses.  Pulmonary:     Effort: Pulmonary effort is normal.  Abdominal:     General: Abdomen is flat.     Palpations: Abdomen is soft.  Musculoskeletal:        General: Normal range of motion.  Skin:    General: Skin is warm and dry.     Capillary Refill: Capillary refill takes less than 2 seconds.  Neurological:     General: No focal deficit present.     Mental Status: She is alert.  Psychiatric:        Mood and Affect: Mood normal.        Behavior: Behavior normal.        Thought Content: Thought content normal.    Data: Carotid Duplex Summary:  Right Carotid: Velocities in the right ICA are consistent with a 40-59%                 stenosis.   Left Carotid: Velocities in the left ICA are consistent with a 40-59%  stenosis.   Vertebrals:  Bilateral vertebral arteries demonstrate antegrade flow.  Subclavians: Normal flow hemodynamics were seen in bilateral subclavian               arteries.        Assessment/Plan:     47-year female with fibromuscular dysplasia with what appears to be stable carotid stenosis bilaterally although I do not have access to previous MRA images she tells me she was  previously 50% stenosed 6 years ago.  She will continue aspirin therapy and follow-up in 1 year with repeat carotid duplex.  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  She understands that these  would require emergent evaluation.     Waynetta Sandy MD [facility]

## 2021-03-20 ENCOUNTER — Other Ambulatory Visit: Payer: Self-pay | Admitting: Internal Medicine

## 2021-03-20 DIAGNOSIS — R928 Other abnormal and inconclusive findings on diagnostic imaging of breast: Secondary | ICD-10-CM

## 2021-04-04 ENCOUNTER — Ambulatory Visit: Payer: 59

## 2021-04-04 ENCOUNTER — Ambulatory Visit
Admission: RE | Admit: 2021-04-04 | Discharge: 2021-04-04 | Disposition: A | Discharge status: Home / Self Care | Payer: 59 | Source: Ambulatory Visit | Attending: Internal Medicine | Admitting: Internal Medicine

## 2021-04-04 ENCOUNTER — Other Ambulatory Visit: Payer: Self-pay

## 2021-04-04 DIAGNOSIS — R928 Other abnormal and inconclusive findings on diagnostic imaging of breast: Secondary | ICD-10-CM

## 2022-09-11 ENCOUNTER — Other Ambulatory Visit: Payer: Self-pay | Admitting: Internal Medicine

## 2022-09-11 DIAGNOSIS — Z1231 Encounter for screening mammogram for malignant neoplasm of breast: Secondary | ICD-10-CM

## 2022-09-16 ENCOUNTER — Ambulatory Visit
Admission: RE | Admit: 2022-09-16 | Discharge: 2022-09-16 | Disposition: A | Discharge status: Home / Self Care | Payer: 59 | Source: Ambulatory Visit

## 2022-09-16 DIAGNOSIS — Z1231 Encounter for screening mammogram for malignant neoplasm of breast: Secondary | ICD-10-CM

## 2022-09-19 ENCOUNTER — Other Ambulatory Visit: Payer: Self-pay | Admitting: Internal Medicine

## 2022-09-19 DIAGNOSIS — R928 Other abnormal and inconclusive findings on diagnostic imaging of breast: Secondary | ICD-10-CM

## 2022-09-27 ENCOUNTER — Ambulatory Visit
Admission: RE | Admit: 2022-09-27 | Discharge: 2022-09-27 | Disposition: A | Discharge status: Home / Self Care | Payer: 59 | Source: Ambulatory Visit | Attending: Internal Medicine | Admitting: Internal Medicine

## 2022-09-27 DIAGNOSIS — R928 Other abnormal and inconclusive findings on diagnostic imaging of breast: Secondary | ICD-10-CM
